# Patient Record
Sex: Male | Born: 1985 | Race: Black or African American | Hispanic: No | Marital: Single | Smoking: Current every day smoker
Health system: Southern US, Community
[De-identification: ages and names within clinical notes are randomized; demographics above are authoritative.]

## PROBLEM LIST (undated history)

## (undated) DIAGNOSIS — Z720 Tobacco use: Secondary | ICD-10-CM

## (undated) DIAGNOSIS — F1910 Other psychoactive substance abuse, uncomplicated: Secondary | ICD-10-CM

## (undated) HISTORY — PX: LIP REPAIR: SHX440

---
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
## 2009-07-23 ENCOUNTER — Ambulatory Visit: Payer: Self-pay | Admitting: Family Medicine

## 2009-07-23 ENCOUNTER — Encounter: Payer: Self-pay | Admitting: Family Medicine

## 2009-07-23 DIAGNOSIS — Z8659 Personal history of other mental and behavioral disorders: Secondary | ICD-10-CM

## 2009-12-15 ENCOUNTER — Emergency Department (HOSPITAL_COMMUNITY): Admission: EM | Admit: 2009-12-15 | Discharge: 2009-12-15 | Payer: Self-pay | Admitting: Emergency Medicine

## 2009-12-23 ENCOUNTER — Emergency Department (HOSPITAL_COMMUNITY): Admission: EM | Admit: 2009-12-23 | Discharge: 2009-12-23 | Payer: Self-pay | Admitting: Emergency Medicine

## 2010-04-03 NOTE — Miscellaneous (Signed)
  Clinical Lists Changes  Medications: Added new medication of METADATE CD 30 MG CR-CAPS (METHYLPHENIDATE HCL) 1 tab twice a day Observations: Added new observation of FAMILY HX: ADD/ADHD (07/23/2009 10:51) Added new observation of SOCIAL HX: Lives with grandmoter Jeremy Hawkins).  Enrolling in school in the fall. 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  Enrolling in school in the fall.

## 2010-04-03 NOTE — Assessment & Plan Note (Signed)
Summary: NP,tcb   Vital Signs:  Patient profile:   25 year old male Height:      63.5 inches Weight:      114 pounds BMI:     19.95 BSA:     1.53 Temp:     98.5 degrees F Pulse rate:   51 / minute BP sitting:   102 / 66  Vitals Entered By: Jone Baseman CMA (Jul 23, 2009 2:49 PM) CC: np Is Patient Diabetic? No Pain Assessment Patient in pain? no        CC:  np.  History of Present Illness: New Patient Visit  Issues 1. ADD:  He has a hx of ADD and was on Adderall.  He hasn't been on it for the past couple of years because he hasn't been able to afford it.  He is now trying to get Medicaid so would like to start taking it again.  He is also thinking about starting school again (wants to go to [employer] for psychology).  His main problem is with concentration and focus on a task.  Health Maintanence: 1. Smokes: not interested in quitting 2. Does not drink or do other drugs 3. Wears his seatbelt 4. Does not exercise or watch his diet  Habits & Providers  Alcohol-Tobacco-Diet     Tobacco Status: current     Tobacco Counseling: to quit use of tobacco products     Cigarette Packs/Day: 0.75  Allergies: No Known Drug Allergies  Past History:  Past Medical History: None  Family History: Reviewed history from 07/23/2009 and no changes required. ADD/ADHD  Social History: Reviewed history from 07/23/2009 and no changes required. Lives with grandmoter Gwendolyn Grant).  Enrolling in school in the fall.Smoking Status:  current Packs/Day:  0.75  Review of Systems  The patient denies fever, weight loss, chest pain, dyspnea on exertion, and headaches.    Physical Exam  General:  Vitals reviewed.  No acute distress. Head:  normocephalic and atraumatic.   Eyes:  vision grossly intact.   Neck:  supple and no masses.   Lungs:  normal respiratory effort, normal breath sounds, no crackles, and no wheezes.   Heart:  normal rate, regular rhythm, and no murmur.     Abdomen:  soft, non-tender, and normal bowel sounds.   Msk:  normal ROM and no joint tenderness.   Extremities:  no lower extremity edema Neurologic:  alert & oriented X3, cranial nerves II-XII intact, strength normal in all extremities, and sensation intact to light touch.   Psych:  poor eye contact.  Normally interactive.  not anxious appearing and not depressed appearing.     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  Patient Instructions: 1)  It was nice to meet you today 2)  We need to do a formal evaluation to make sure that you still have AD/HD and still require treatment 3)  We do that through [facility] and their AD/HD clinic 4)  Their number is [phone] 5)  Please schedule an appointment with them 6)  Schedule an appointment with me after you are seen there

## 2012-09-13 ENCOUNTER — Encounter (HOSPITAL_COMMUNITY): Payer: Self-pay | Admitting: Emergency Medicine

## 2012-09-13 ENCOUNTER — Emergency Department (HOSPITAL_COMMUNITY): Payer: Self-pay

## 2012-09-13 ENCOUNTER — Emergency Department (HOSPITAL_COMMUNITY)
Admission: EM | Admit: 2012-09-13 | Discharge: 2012-09-13 | Disposition: A | Discharge status: Home / Self Care | Payer: Self-pay | Attending: Emergency Medicine | Admitting: Emergency Medicine

## 2012-09-13 DIAGNOSIS — M940 Chondrocostal junction syndrome [Tietze]: Secondary | ICD-10-CM | POA: Insufficient documentation

## 2012-09-13 DIAGNOSIS — F17200 Nicotine dependence, unspecified, uncomplicated: Secondary | ICD-10-CM | POA: Insufficient documentation

## 2012-09-13 LAB — URINALYSIS, ROUTINE W REFLEX MICROSCOPIC
Bilirubin Urine: NEGATIVE
Glucose, UA: NEGATIVE mg/dL
Hgb urine dipstick: NEGATIVE
Ketones, ur: NEGATIVE mg/dL
Leukocytes, UA: NEGATIVE
Nitrite: NEGATIVE
Protein, ur: NEGATIVE mg/dL
Specific Gravity, Urine: 1.025 (ref 1.005–1.030)
Urobilinogen, UA: 2 mg/dL — ABNORMAL HIGH (ref 0.0–1.0)
pH: 6.5 (ref 5.0–8.0)

## 2012-09-13 LAB — CBC WITH DIFFERENTIAL/PLATELET
Basophils Absolute: 0 10*3/uL (ref 0.0–0.1)
Basophils Relative: 1 % (ref 0–1)
Eosinophils Absolute: 0.3 10*3/uL (ref 0.0–0.7)
Eosinophils Relative: 4 % (ref 0–5)
HCT: 39.5 % (ref 39.0–52.0)
Hemoglobin: 13.7 g/dL (ref 13.0–17.0)
Lymphocytes Relative: 40 % (ref 12–46)
Lymphs Abs: 2.6 10*3/uL (ref 0.7–4.0)
MCH: 30.6 pg (ref 26.0–34.0)
MCHC: 34.7 g/dL (ref 30.0–36.0)
MCV: 88.2 fL (ref 78.0–100.0)
Monocytes Absolute: 0.6 10*3/uL (ref 0.1–1.0)
Monocytes Relative: 10 % (ref 3–12)
Neutro Abs: 2.9 10*3/uL (ref 1.7–7.7)
Neutrophils Relative %: 46 % (ref 43–77)
Platelets: 266 10*3/uL (ref 150–400)
RBC: 4.48 MIL/uL (ref 4.22–5.81)
RDW: 13.5 % (ref 11.5–15.5)
WBC: 6.4 10*3/uL (ref 4.0–10.5)

## 2012-09-13 LAB — BASIC METABOLIC PANEL
BUN: 19 mg/dL (ref 6–23)
CO2: 31 mEq/L (ref 19–32)
Calcium: 9.2 mg/dL (ref 8.4–10.5)
Chloride: 97 mEq/L (ref 96–112)
Creatinine, Ser: 1.15 mg/dL (ref 0.50–1.35)
GFR calc Af Amer: >90 mL/min (ref >90)
GFR calc non Af Amer: 85 mL/min — ABNORMAL LOW (ref >90)
Glucose, Bld: 78 mg/dL (ref 70–99)
Potassium: 3.4 mEq/L — ABNORMAL LOW (ref 3.5–5.1)
Sodium: 137 mEq/L (ref 135–145)

## 2012-09-13 MED ORDER — IBUPROFEN 800 MG PO TABS
800.0000 mg | ORAL_TABLET | Freq: Once | ORAL | Status: AC
  Administered 2012-09-13: 800 mg via ORAL
  Filled 2012-09-13: qty 1

## 2012-09-13 MED ORDER — IBUPROFEN 400 MG PO TABS: 400.0000 mg | ORAL_TABLET | Freq: Four times a day (QID) | ORAL | Status: DC | PRN

## 2012-09-13 MED ORDER — METHOCARBAMOL 500 MG PO TABS: 500.0000 mg | ORAL_TABLET | Freq: Two times a day (BID) | ORAL | Status: DC

## 2012-09-13 NOTE — ED Notes (Signed)
Patient states R flank pain and R abdominal pain.  Patient states started x 2 days ago.   Patient denies urinary symptoms at all.   Patient denies nausea/vomiting/diarrhea.   Patient described pain as 8/10 sharp, intermittent pain.

## 2012-09-13 NOTE — ED Provider Notes (Signed)
Medical screening examination/treatment/procedure(s) were performed by non-physician practitioner and as supervising physician I was immediately available for consultation/collaboration.  Dilia Alemany T Amyah Clawson, MD 09/13/12 1020 

## 2012-09-13 NOTE — ED Provider Notes (Signed)
History    CSN: [id number] Arrival date & time 09/13/12  0727  First MD Initiated Contact with Patient 09/13/12 [phone]     Chief Complaint  Patient presents with  . Abdominal Pain   (Consider location/radiation/quality/duration/timing/severity/associated sxs/prior Treatment) HPI  27 year old male with no significant past medical history presents complaining of right flank pain. Patient states he was in the dark when he does a lot of heavy lifting. 2 days ago he woke up noticing pain to his right side of chest and abdomen. Pain is described as a sharp and throbbing sensation worsening with movement. He reports when he was lifting boxes at work yesterday, he felt shortness of breath with increasing pain with lifting. He was recommended by his job to have a medical evaluation before returning to work. Otherwise patient denies fever, chills, back pain, dysuria, hematuria, nausea, vomiting, diarrhea, numbness, weakness, or rash. No specific treatment tried. No prior history of kidney stone. No prior abdominal surgery. No loss of appetite. Patient is a 0.25 pack-a-day smoker and an occasional social drinker.  History reviewed. No pertinent past medical history. Past Surgical History  Procedure Laterality Date  . Lip repair     No family history on file. History  Substance Use Topics  . Smoking status: Current Every Day Smoker -- 0.25 packs/day    Types: Cigarettes  . Smokeless tobacco: Not on file  . Alcohol Use: 3.6 oz/week    6 Cans of beer per week    Review of Systems  Constitutional: Negative for fever.  HENT: Negative for neck pain.   Respiratory: Positive for shortness of breath. Negative for cough, chest tightness and wheezing.   Gastrointestinal: Negative for abdominal pain.  Musculoskeletal: Negative for back pain.  Skin: Negative for rash.  All other systems reviewed and are negative.    Allergies  Review of patient's allergies indicates no known allergies.  Home  Medications  No current outpatient prescriptions on file. BP 121/62  Pulse 56  Temp(Src) 98.1 F (36.7 C) (Oral)  Resp 18  Ht 5\' 3"  (1.6 m)  Wt 120 lb (54.432 kg)  BMI 21.26 kg/m2  SpO2 99% Physical Exam  Nursing note and vitals reviewed. Constitutional: He appears well-developed and well-nourished. No distress.  HENT:  Head: Atraumatic.  Eyes: Conjunctivae are normal.  Neck: Neck supple.  Cardiovascular:  Irregularly irregular rhythm, with no murmurs, rubs or gallops noted  Pulmonary/Chest: Effort normal and breath sounds normal. He has no wheezes. He has no rales. He exhibits tenderness (Tenderness to right anterolateral lower ribs on palpation without crepitus, emphysema, or deformity noted).  Abdominal: There is no tenderness. There is no guarding.  No Murphy sign, no McBurney's point  Genitourinary:  No CVA tenderness  Musculoskeletal: He exhibits no edema.  No significant midline spine tenderness, crepitus, or step off noted  Neurological: He is alert.  Skin: No rash noted.  Psychiatric: He has a normal mood and affect.    ED Course  Procedures (including critical care time)  8:02 AM Patient presents with right lower rib pain, pain is reproducible on exam. Pain is worsening with movement. I suspect this is musculoskeletal in origin. Patient does complain of some shortness of breath and increasing pain with lifting, will obtain a right rib x-ray to rule out occult fracture or any evidence of pneumothorax. Abdominal exam is unremarkable. He has no CVA tenderness. I have low suspicion of kidney stones, appendicitis, or biliary disease.  Patient does have an irregular heart rhythm however  he is not symptomatic.  10:10 AM Work up unremarkable, pt felt better after taking ibuprofen.  Pain likely MSK in nature.  Will treat conservatively.  Pt given strict return precaution.  Pt stable for discharge.   Labs Reviewed  BASIC METABOLIC PANEL - Abnormal; Notable for the following:     Potassium 3.4 (*)    GFR calc non Af Amer 85 (*)    All other components within normal limits  URINALYSIS, ROUTINE W REFLEX MICROSCOPIC - Abnormal; Notable for the following:    Urobilinogen, UA 2.0 (*)    All other components within normal limits  CBC WITH DIFFERENTIAL   Dg Ribs Unilateral W/chest Right  09/13/2012   *RADIOLOGY REPORT*  Clinical Data: Abdominal pain, week pain  RIGHT RIBS AND CHEST - 3+ VIEW  Comparison: None.  Findings: Three views right ribs submitted.  No acute infiltrate or pulmonary edema.  No right rib fracture is identified.  No diagnostic pneumothorax.  IMPRESSION: No right rib fracture is identified.  No diagnostic pneumothorax.   Original Report Authenticated By: Natasha Mead, M.D.   1. Costochondritis, acute     MDM  BP 99/51  Pulse 76  Temp(Src) 98.1 F (36.7 C) (Oral)  Resp 18  Ht 5\' 3"  (1.6 m)  Wt 120 lb (54.432 kg)  BMI 21.26 kg/m2  SpO2 99%   Fayrene Helper, PA-C 09/13/12 1011

## 2012-09-14 ENCOUNTER — Telehealth (HOSPITAL_COMMUNITY): Payer: Self-pay | Admitting: Emergency Medicine

## 2014-01-08 ENCOUNTER — Encounter (HOSPITAL_COMMUNITY): Payer: Self-pay | Admitting: Emergency Medicine

## 2014-01-08 ENCOUNTER — Emergency Department (HOSPITAL_COMMUNITY)
Admission: EM | Admit: 2014-01-08 | Discharge: 2014-01-08 | Disposition: A | Discharge status: Home / Self Care | Payer: Self-pay | Attending: Emergency Medicine | Admitting: Emergency Medicine

## 2014-01-08 DIAGNOSIS — Z79899 Other long term (current) drug therapy: Secondary | ICD-10-CM | POA: Insufficient documentation

## 2014-01-08 DIAGNOSIS — K0889 Other specified disorders of teeth and supporting structures: Secondary | ICD-10-CM

## 2014-01-08 DIAGNOSIS — K088 Other specified disorders of teeth and supporting structures: Secondary | ICD-10-CM | POA: Insufficient documentation

## 2014-01-08 DIAGNOSIS — K0381 Cracked tooth: Secondary | ICD-10-CM | POA: Insufficient documentation

## 2014-01-08 DIAGNOSIS — Z720 Tobacco use: Secondary | ICD-10-CM | POA: Insufficient documentation

## 2014-01-08 MED ORDER — PENICILLIN V POTASSIUM 500 MG PO TABS: 500.0000 mg | ORAL_TABLET | Freq: Four times a day (QID) | ORAL | Status: AC

## 2014-01-08 MED ORDER — PENICILLIN V POTASSIUM 250 MG PO TABS
500.0000 mg | ORAL_TABLET | Freq: Once | ORAL | Status: AC
  Administered 2014-01-08: 500 mg via ORAL
  Filled 2014-01-08: qty 2

## 2014-01-08 MED ORDER — HYDROCODONE-ACETAMINOPHEN 5-325 MG PO TABS
2.0000 | ORAL_TABLET | Freq: Once | ORAL | Status: AC
  Administered 2014-01-08: 2 via ORAL
  Filled 2014-01-08: qty 2

## 2014-01-08 MED ORDER — HYDROCODONE-ACETAMINOPHEN 5-325 MG PO TABS: 1.0000 | ORAL_TABLET | ORAL | Status: DC | PRN

## 2014-01-08 NOTE — ED Provider Notes (Signed)
CSN: [id number]     Arrival date & time 01/08/14  1820 History  This chart was scribed for non-physician practitioner, Harle BattiestElizabeth Uliana Brinker, NP working with Gwyneth SproutWhitney Plunkett, MD by Gwenyth Oberatherine Macek, ED scribe. This patient was seen in room TR05C/TR05C and the patient's care was started at 6:34 PM.   Chief Complaint  Patient presents with  . Dental Pain   The history is provided by the patient. No language interpreter was used.    HPI Comments: Jeremy Hawkins is a 28 y.o. male who presents to the Emergency Department complaining of gradually worsening, constant dental pain in 3rd left molar that started several months ago when he felt his tooth break. Pt states that his tooth is cracked and that a nerve is exposed. He has not tried any treatments for his symptoms. Pt denies any other tooth pain.  History reviewed. No pertinent past medical history. Past Surgical History  Procedure Laterality Date  . Lip repair     No family history on file. History  Substance Use Topics  . Smoking status: Current Every Day Smoker -- 0.25 packs/day    Types: Cigarettes  . Smokeless tobacco: Not on file  . Alcohol Use: 3.6 oz/week    6 Cans of beer per week    Review of Systems  Constitutional: Negative for fever.  HENT: Positive for dental problem. Negative for facial swelling.     Allergies  Review of patient's allergies indicates no known allergies.  Home Medications   Prior to Admission medications   Medication Sig Start Date End Date Taking? Authorizing Provider  ibuprofen (ADVIL,MOTRIN) 400 MG tablet Take 1 tablet (400 mg total) by mouth every 6 (six) hours as needed for pain. 09/13/12   Fayrene HelperBowie Tran, PA-C  methocarbamol (ROBAXIN) 500 MG tablet Take 1 tablet (500 mg total) by mouth 2 (two) times daily. 09/13/12   Fayrene HelperBowie Tran, PA-C   BP 116/75 mmHg  Pulse 74  Temp(Src) 98.6 F (37 C)  Resp 16  Wt 122 lb 3 oz (55.424 kg)  SpO2 100% Physical Exam  Constitutional: He is oriented to  person, place, and time. He appears well-developed and well-nourished. No distress.  HENT:  Head: Normocephalic and atraumatic.  Mouth/Throat: Oropharynx is clear and moist. No oropharyngeal exudate.  Top left rear molar cracked and broken No cervical lymphadenopathy No facial swelling  Eyes: Pupils are equal, round, and reactive to light.  Neck: Neck supple.  Cardiovascular: Normal rate.   Pulmonary/Chest: Effort normal.  Musculoskeletal: He exhibits no edema.  Lymphadenopathy:    He has no cervical adenopathy.  Neurological: He is alert and oriented to person, place, and time. No cranial nerve deficit.  Skin: Skin is warm and dry. No rash noted.  Psychiatric: He has a normal mood and affect. His behavior is normal.  Nursing note and vitals reviewed.   ED Course  Procedures (including critical care time) DIAGNOSTIC STUDIES: Oxygen Saturation is 100% on RA, normal by my interpretation.    COORDINATION OF CARE: 6:39 PM Discussed treatment plan with pt at bedside and pt agreed to plan.  Labs Review Labs Reviewed - No data to display  Imaging Review No results found.   EKG Interpretation None      MDM   Final diagnoses:  Pain, dental   28 yo male with toothache from broken tooth x several months.  There is no gross abscess or soft tissue swelling noted.  Exam unconcerning for Ludwig's angina or spread of infection.  Will treat  with penicillin and pain medicine.  Discharge instructions include referral to  follow-up with dentist.  Pt aware of plan and in agreement.  Return precautions provided.  I personally performed the services described in this documentation, which was scribed in my presence. The recorded information has been reviewed and is accurate.  Filed Vitals:   01/08/14 1827  BP: 116/75  Pulse: 74  Temp: 98.6 F (37 C)  Resp: 16  Weight: 122 lb 3 oz (55.424 kg)  SpO2: 100%   Meds given in ED:  Medications  penicillin v potassium (VEETID) tablet 500  mg (not administered)  HYDROcodone-acetaminophen (NORCO/VICODIN) 5-325 MG per tablet 2 tablet (not administered)    New Prescriptions   HYDROCODONE-ACETAMINOPHEN (NORCO/VICODIN) 5-325 MG PER TABLET    Take 1-2 tablets by mouth every 4 (four) hours as needed for moderate pain or severe pain.   PENICILLIN V POTASSIUM (VEETID) 500 MG TABLET    Take 1 tablet (500 mg total) by mouth 4 (four) times daily.      Harle BattiestElizabeth Jonnathan Birman, NP 01/08/14 1851  Gwyneth SproutWhitney Plunkett, MD 01/09/14 [phone]

## 2014-01-08 NOTE — Discharge Instructions (Signed)
Please follow the directions provided. It is very important that you follow-up with the dental referral given to treat this broken tooth. The medicines given today will help with pain and possible infection, but it will not fix the broken tooth. Take your pain medicine and antibiotic as directed. Don't hesitate to return for any new, worsening, or concerning symptoms.  SEEK IMMEDIATE MEDICAL CARE IF:  You have a fever.  You develop redness and swelling of your face, jaw, or neck.  You are unable to open your mouth.  You have severe pain uncontrolled by pain medicine.    Emergency Department Resource Guide 1) Find a Doctor and Pay Out of Pocket Although you won't have to find out who is covered by your insurance plan, it is a good idea to ask around and get recommendations. You will then need to call the office and see if the doctor you have chosen will accept you as a new patient and what types of options they offer for patients who are self-pay. Some doctors offer discounts or will set up payment plans for their patients who do not have insurance, but you will need to ask so you aren't surprised when you get to your appointment.  2) Contact Your Local Health Department Not all health departments have doctors that can see patients for sick visits, but many do, so it is worth a call to see if yours does. If you don't know where your local health department is, you can check in your phone book. The CDC also has a tool to help you locate your state's health department, and many state websites also have listings of all of their local health departments.  3) Find a Walk-in Clinic If your illness is not likely to be very severe or complicated, you may want to try a walk in clinic. These are popping up all over the country in pharmacies, drugstores, and shopping centers. They're usually staffed by nurse practitioners or physician assistants that have been trained to treat common illnesses and complaints.  They're usually fairly quick and inexpensive. However, if you have serious medical issues or chronic medical problems, these are probably not your best option.  No Primary Care Doctor: - Call Health Connect at  [phone] - they can help you locate a primary care doctor that  accepts your insurance, provides certain services, etc. - Physician Referral Service- [phone]  Chronic Pain Problems: Organization         Address  Phone   Notes  [facility]  [phone] Patients need to be referred by their primary care doctor.   Medication Assistance: Organization         Address  Phone   Notes  [facility] [address] [place] [place], [place] [address] [phone] --Must be a resident of [facility] -- Must have NO insurance coverage whatsoever (no Medicaid/ Medicare, etc.) -- The pt. MUST have a primary care doctor that directs their care regularly and follows them in the community   Med[facility]ist  [phone]   [employer]  [phone]    Agencies that provide inexpensive medical care: Organization         Address  Phone   Notes  [facility]  [phone]   [facility]    [phone]   [facility] [address] [place], [place] [address] [phone]   [facility]  [address], [place] [phone]) [phone]   Planned Parenthood    [phone]   [facility]    [phone]   Community Health and [facility]  [address], [place] Phone:  [phone]) [phone], Fax:  [phone] Hours of Operation:  9 am - 6 pm, M-F.  Also accepts Medicaid/Medicare and self-pay.  [facility]  [address], [place] Phone: [phone], Fax: [phone]. Hours of Operation:  8:30 am - 5:30 pm, M-F.  Also accepts Medicaid and self-pay.  [facility] [place] [address], [place]  Phone: [phone]   [facility] [address], [place], [place] [phone], Ext. [phone] Mondays & Thursdays: 7-9 AM.  First 15 patients are seen on a first come, first serve basis.    Medicaid-accepting [facility] Providers:  Organization         Address  Phone   Notes  [facility] [address], [place] [phone] Also accepts self-pay patients.  [facility] [address] [place], [place]  [phone]   [facility] [address], [place] [phone]) [phone]   [facility] [address] [place] [address], [place] [phone]) [phone]   Renaye RakersVeita Bland [address], [place]   [phone] Only accepts [place] Access [place]icaid patients after they have their name applied to their card.   Self-Pay (no insurance) in [facility]:  Organization         Address  Phone   Notes  Sickle Cell Patients, [facility] [address], [place] [phone]   [facility] [address], [place] [phone]) [phone]   [facility]  [address], [place] [phone]   [facility]/Dr. Osei-Bonsu  [address] [place] [address], [place] or [phone], [place] [phone] Phone number for both [place] and [place] locations is the same.  [facility]gent [facility] and [facility] [address], [place] [phone]) [phone]   [facility] [place] [address] [place] [address], [place] or [address] [phone]) [phone]   [facility] [address], [place] [phone]) [phone], phone; [phone], fax Sees patients 1st and 3rd Saturday of every month.  Must not qualify for public or private insurance (i.e. Medicaid, Medicare, [place] Health Choice, Veterans' Benefits)  Household income should be no more than 200% of the poverty level The clinic cannot  treat you if you are pregnant or think you are pregnant  Sexually transmitted diseases are not treated at the clinic.    Dental Care: Organization         Address  Phone  Notes  [facility] Department of [facility] [address], [place] [phone] Accepts children up to age 921 who are enrolled in [place]icaid or [place] Health Choice; pregnant women with a Medicaid card; and children who have applied for Medicaid or [place] Health Choice, but were declined, whose parents can pay a reduced fee at time of service.  [facility] of [facility] [place]  [address], [place] [phone] Accepts children up to age 30 who are enrolled in [place]icaid or [place] Health Choice; pregnant women with a Medicaid card; and children who have applied for  Medicaid or [place] Health Choice, but were declined, whose parents can pay a reduced fee at time of service.  [facility]  [address], [place] [phone] Patients are seen by appointment only. Walk-ins are not accepted. [facility] will see patients 30 years of age and older. Monday - Tuesday (8am-5pm) Most Wednesdays (8:30-5pm) $30 per visit, cash only  [facility]  [address], [facility] [phone] Patients are seen by appointment only. Walk-ins are not accepted. [facility] will see patients 30 years of age and older. One Wednesday Evening (Monthly: Volunteer Based).  $30 per visit, cash only  [facility]  [phone] for adults; Children under age 604, call Graduate Pediatric Dentistry at [phone]. Children aged 204-14, please call [phone] to request a pediatric application.  Dental services are provided in all areas of dental care including fillings, crowns and bridges, complete and partial dentures, implants, gum treatment, root canals, and extractions. Preventive care is also provided.  Treatment is provided to both adults and children. Patients are selected via a lottery and there is often a waiting list.   [facility] [address], [place]  [phone] [email or web address]   [facility] [address], [place], [place] [phone], Ext. [phone] Second and Fourth Thursday of each month, opens at 6:30 AM; Clinic ends at 9 AM.  Patients are seen on a first-come first-served basis, and a limited number are seen during each clinic.   [facility]  [address], [place], [place] [phone]   Eligibility Requirements You must have lived in [place], [place], or [place] counties for at least the last three months.   You cannot be eligible for state or federal sponsored National Cityhealthcare insurance, including CIGNAVeterans Administration, [place]icaid, or [employer]Medicare.   You generally cannot be eligible for healthcare insurance through your employer.    How to apply: Eligibility screenings are held every Tuesday and Wednesday afternoon from 1:00 pm until 4:00 pm. You do not need an appointment for the interview!  [facility] [address], [place], [place] [phone]   [facility]  [phone]   [facility]  [phone]   [facility]  [phone]    Behavioral Health Resources in the Community: Intensive Outpatient Programs Organization         Address  Phone  Notes  [facility] [address], [place], [place] [phone]   [facility] [address], [place], [place] [phone]   [facility] [address], [place], [place]  [phone]   [facility] [address],  [place], [place] [phone] or [phone]   Substance Abuse Resources Organization         Address  Phone  Notes  [facility]  [phone]   [facility]   [phone]   [facility]  [phone]   [facility]  [phone]   Residential & Outpatient Substance Abuse Program  [phone]   Psychological Services Organization         Address  Phone  Notes  [facility]  [phone]- [phone]   [facility]  [phone]- [phone]   [facility] [address], [place] [phone] or [phone]    Mobile Crisis Teams Organization         Address  Phone  Notes  [facility], Mobile Crisis Care Unit  [phone]   [facility]  [address] [place], [place] [phone]   [facility] [address] [place] [phone]    Self-Help/Support Groups Organization         Address  Phone             Notes  [facility] [place] - variety of support groups  [phone] Call for more information  Narcotics Anonymous (NA), Caring [facility] [address], [employer] [place]  2 meetings at this location   Statistician         Address  Phone  Notes  [facility] [address],    [place]  [phone]   [facility]  [address], [place] [address], [place], [place] [phone]   [facility] [address] [place], [place] [phone] Admissions: 8am-3pm M-F  [facility] [address],    [place], [place] [phone]   [facility] [address] [place], [place], [place] [phone]   [facility] [address]  [place], [place] [phone]   [facility] - Intensive Outpatient [address], [place], [place] [phone]   [facility] ([facility]iction [facility] [address]  [place], [place] [phone] or [phone]   [facility] (RTS) [address] [place], [place] [phone] Accepts Medicaid  [facility] [place] [address]  [place] [phone] Substance  Abuse/Addiction Treatment   [facility] Organization         Address  Phone  Notes  [facility]  [phone]   Angie Fava, PhD [address] [place], [place]   [phone] or [phone]   [facility]   [address] [place], [place] [phone]   [facility] [address], [place], [place] [phone] Insurance/Medicaid/sponsorship through [employer] and Families [address]                                    [place], [place] [phone] Therapy/tele-psych/case  [facility] [address], [place] [phone]    Dr. Lolly Mustache  [phone]   [facility]  [employer] [facility] Dept. 1) [address] [place] 2) [address] [place] 3)  [address], [place] [phone]  [phone]   [facility] Child Abuse Hotline [phone] or [phone] (After Hours)

## 2014-01-08 NOTE — ED Notes (Signed)
Refused wheelchair 

## 2014-01-08 NOTE — ED Notes (Signed)
Pt reports top L wisdom tooth pain from it being broken months ago. sts nerve is exposed.

## 2014-01-10 ENCOUNTER — Telehealth (HOSPITAL_BASED_OUTPATIENT_CLINIC_OR_DEPARTMENT_OTHER): Payer: Self-pay | Admitting: Emergency Medicine

## 2014-12-11 ENCOUNTER — Emergency Department (HOSPITAL_COMMUNITY): Payer: Self-pay

## 2014-12-11 ENCOUNTER — Encounter (HOSPITAL_COMMUNITY): Payer: Self-pay

## 2014-12-11 ENCOUNTER — Observation Stay (HOSPITAL_COMMUNITY)
Admission: EM | Admit: 2014-12-11 | Discharge: 2014-12-12 | Disposition: A | Discharge status: Home / Self Care | Payer: Self-pay | Attending: Cardiology | Admitting: Cardiology

## 2014-12-11 DIAGNOSIS — R072 Precordial pain: Secondary | ICD-10-CM

## 2014-12-11 DIAGNOSIS — F1910 Other psychoactive substance abuse, uncomplicated: Secondary | ICD-10-CM | POA: Insufficient documentation

## 2014-12-11 DIAGNOSIS — Z720 Tobacco use: Secondary | ICD-10-CM

## 2014-12-11 DIAGNOSIS — R079 Chest pain, unspecified: Secondary | ICD-10-CM

## 2014-12-11 DIAGNOSIS — R7989 Other specified abnormal findings of blood chemistry: Secondary | ICD-10-CM | POA: Insufficient documentation

## 2014-12-11 DIAGNOSIS — R9431 Abnormal electrocardiogram [ECG] [EKG]: Secondary | ICD-10-CM

## 2014-12-11 DIAGNOSIS — R55 Syncope and collapse: Principal | ICD-10-CM

## 2014-12-11 HISTORY — DX: Tobacco use: Z72.0

## 2014-12-11 HISTORY — DX: Other psychoactive substance abuse, uncomplicated: F19.10

## 2014-12-11 LAB — CBC
HEMATOCRIT: 41.5 % (ref 39.0–52.0)
HEMOGLOBIN: 14.2 g/dL (ref 13.0–17.0)
MCH: 30.2 pg (ref 26.0–34.0)
MCHC: 34.2 g/dL (ref 30.0–36.0)
MCV: 88.3 fL (ref 78.0–100.0)
Platelets: 297 10*3/uL (ref 150–400)
RBC: 4.7 MIL/uL (ref 4.22–5.81)
RDW: 13.9 % (ref 11.5–15.5)
WBC: 6.5 10*3/uL (ref 4.0–10.5)

## 2014-12-11 LAB — CBC WITH DIFFERENTIAL/PLATELET
BASOS ABS: 0.1 10*3/uL (ref 0.0–0.1)
Basophils Relative: 1 %
EOS ABS: 1 10*3/uL — AB (ref 0.0–0.7)
EOS PCT: 16 %
HCT: 40.5 % (ref 39.0–52.0)
Hemoglobin: 13.8 g/dL (ref 13.0–17.0)
Lymphocytes Relative: 49 %
Lymphs Abs: 2.8 10*3/uL (ref 0.7–4.0)
MCH: 30 pg (ref 26.0–34.0)
MCHC: 34.1 g/dL (ref 30.0–36.0)
MCV: 88 fL (ref 78.0–100.0)
Monocytes Absolute: 0.5 10*3/uL (ref 0.1–1.0)
Monocytes Relative: 8 %
Neutro Abs: 1.6 10*3/uL — ABNORMAL LOW (ref 1.7–7.7)
Neutrophils Relative %: 26 %
PLATELETS: 298 10*3/uL (ref 150–400)
RBC: 4.6 MIL/uL (ref 4.22–5.81)
RDW: 14 % (ref 11.5–15.5)
WBC: 5.9 10*3/uL (ref 4.0–10.5)

## 2014-12-11 LAB — BASIC METABOLIC PANEL
ANION GAP: 9 (ref 5–15)
CALCIUM: 8.3 mg/dL — AB (ref 8.9–10.3)
CO2: 28 mmol/L (ref 22–32)
Chloride: 103 mmol/L (ref 101–111)
Creatinine, Ser: 0.84 mg/dL (ref 0.61–1.24)
GFR calc Af Amer: >60 mL/min (ref >60)
Glucose, Bld: 96 mg/dL (ref 65–99)
POTASSIUM: 3.6 mmol/L (ref 3.5–5.1)
SODIUM: 140 mmol/L (ref 135–145)

## 2014-12-11 LAB — CREATININE, SERUM: Creatinine, Ser: 0.88 mg/dL (ref 0.61–1.24)

## 2014-12-11 LAB — TSH: TSH: 3.696 u[IU]/mL (ref 0.350–4.500)

## 2014-12-11 LAB — TROPONIN I: Troponin I: <0.03 ng/mL (ref <0.031)

## 2014-12-11 LAB — I-STAT TROPONIN, ED: TROPONIN I, POC: 0 ng/mL (ref 0.00–0.08)

## 2014-12-11 LAB — MAGNESIUM: Magnesium: 1.9 mg/dL (ref 1.7–2.4)

## 2014-12-11 LAB — CBG MONITORING, ED: GLUCOSE-CAPILLARY: 76 mg/dL (ref 65–99)

## 2014-12-11 LAB — PROTIME-INR
INR: 1.02 (ref 0.00–1.49)
Prothrombin Time: 13.6 seconds (ref 11.6–15.2)

## 2014-12-11 MED ORDER — ENOXAPARIN SODIUM 40 MG/0.4ML ~~LOC~~ SOLN
40.0000 mg | SUBCUTANEOUS | Status: DC
  Administered 2014-12-11: 40 mg via SUBCUTANEOUS
  Filled 2014-12-11: qty 0.4

## 2014-12-11 MED ORDER — ASPIRIN 300 MG RE SUPP: 300.0000 mg | RECTAL | Status: AC

## 2014-12-11 MED ORDER — NITROGLYCERIN 0.4 MG SL SUBL
0.4000 mg | SUBLINGUAL_TABLET | SUBLINGUAL | Status: DC | PRN
  Administered 2014-12-11: 0.4 mg via SUBLINGUAL
  Filled 2014-12-11: qty 1

## 2014-12-11 MED ORDER — SODIUM CHLORIDE 0.9 % IV SOLN: 250.0000 mL | INTRAVENOUS | Status: DC | PRN

## 2014-12-11 MED ORDER — ONDANSETRON HCL 4 MG/2ML IJ SOLN
4.0000 mg | Freq: Four times a day (QID) | INTRAMUSCULAR | Status: DC | PRN
  Administered 2014-12-11: 4 mg via INTRAVENOUS
  Filled 2014-12-11 (×2): qty 2

## 2014-12-11 MED ORDER — SODIUM CHLORIDE 0.9 % IJ SOLN
3.0000 mL | Freq: Two times a day (BID) | INTRAMUSCULAR | Status: DC
  Administered 2014-12-11: 3 mL via INTRAVENOUS

## 2014-12-11 MED ORDER — ONDANSETRON HCL 4 MG/2ML IJ SOLN
4.0000 mg | Freq: Once | INTRAMUSCULAR | Status: AC
  Administered 2014-12-11: 4 mg via INTRAVENOUS
  Filled 2014-12-11: qty 2

## 2014-12-11 MED ORDER — ASPIRIN 81 MG PO CHEW: 324.0000 mg | CHEWABLE_TABLET | ORAL | Status: AC

## 2014-12-11 MED ORDER — SODIUM CHLORIDE 0.9 % IJ SOLN: 3.0000 mL | INTRAMUSCULAR | Status: DC | PRN

## 2014-12-11 MED ORDER — MORPHINE SULFATE (PF) 4 MG/ML IV SOLN
4.0000 mg | Freq: Once | INTRAVENOUS | Status: AC
  Administered 2014-12-11: 4 mg via INTRAVENOUS
  Filled 2014-12-11: qty 1

## 2014-12-11 MED ORDER — ACETAMINOPHEN 325 MG PO TABS: 650.0000 mg | ORAL_TABLET | ORAL | Status: DC | PRN

## 2014-12-11 MED ORDER — ASPIRIN 81 MG PO CHEW
324.0000 mg | CHEWABLE_TABLET | Freq: Once | ORAL | Status: AC
  Administered 2014-12-11: 324 mg via ORAL
  Filled 2014-12-11: qty 4

## 2014-12-11 NOTE — ED Notes (Signed)
Pt. Was in a meeting yesterday, and had a syncopal episode.  Pt. Reports that he is having a tough time and has had poor food intake due to financial .  Prior to the syncopal episode.  Pt. Had chest pain.  Also hit his head.  Pt. 's witnesses at work reports that pt. Was having seizure like activity.  Pt. Has no hx of seizure .  His work notified him this am that he needs a work excuse.  Pt. Continues to feel weak and is having many stressors. Pt. Is alert and oriented X4.  Pt. Reports having chest pain lt. Side, non-radiating , sharp,  Pt. Denies any factors that relieve or worsen the pain.

## 2014-12-11 NOTE — H&P (Signed)
Patient ID: RONDALE Hawkins MRN: [id number], DOB/AGE: 1985/09/17   Admit date: 12/11/2014   Primary Physician: No primary care provider on file. Primary Cardiologist: New CC: CP and syncope  Pt. Profile:  Jeremy Hawkins is a 29 y.o. male with a history of tobacco abuse and occasional cocaine use who presented to [facility] with chest pain and syncopal episode yesterday.   He is a thin AA male with no past medical hx of DM, HTN, HLD, CVA.   Patient operates heavy machinery for work. He was in a work meeting yesterday ~11:30 am he developed left sternal border chest pain that was severe 10/10, he clutched his chest and became very lightheaded then had a syncopal episode, he described the pain as overwhelming and stabbing. No post ictal sx or loss of urine or stool. Co-workers report he did hit his head from a sitting position. Since then he has been having ongoing, intermittent CP that lasts a couple of minutes and then resolves, currently he is having CP that is 7/10 and a squeezing pressure that is assocaited with mild nausea. It is not worse supine and better leaning forward. Not worse with inspiration. He denies hx of chest pains. He has not had any associated diaphoresis or shortness of breath. He denies exertional sx. No palpitations, weakness, confusion, lower extremity swelling, orthopnea, or PND.   He admits to occasional cocaine use (~ 1x/mo) and the last time he used was 3 days ago but not yesterday. He also reports being very stressed lately. He has been out of work, got out of a long relationship with his girlfriend and moved in with his grandmother. He has had poor PO intake due to financial issues. Has been feeling weak. He presented to the hospital today due to weakness and ongoing chest pain. He said he just couldn't go to work as he was too weak. No family hx of CAD but his mother is deceased and he does not have a relationship with his father. His grandmother has  HTN.    Problem List  History reviewed. No pertinent past medical history.  Past Surgical History  Procedure Laterality Date  . Lip repair       Allergies  No Known Allergies   Home Medications  Prior to Admission medications   Medication Sig Start Date End Date Taking? Authorizing Provider  HYDROcodone-acetaminophen (NORCO/VICODIN) 5-325 MG per tablet Take 1-2 tablets by mouth every 4 (four) hours as needed for moderate pain or severe pain. 01/08/14   Harle Battiest, NP  ibuprofen (ADVIL,MOTRIN) 400 MG tablet Take 1 tablet (400 mg total) by mouth every 6 (six) hours as needed for pain. 09/13/12   Fayrene Helper, PA-C  methocarbamol (ROBAXIN) 500 MG tablet Take 1 tablet (500 mg total) by mouth 2 (two) times daily. 09/13/12   Fayrene Helper, PA-C    Family History  No family history on file. No family status information on file.     Social History  Social History   Social History  . Marital Status: Single    Spouse Name: N/A  . Number of Children: N/A  . Years of Education: N/A   Occupational History  . Not on file.   Social History Main Topics  . Smoking status: Current Every Day Smoker -- 0.25 packs/day    Types: Cigarettes  . Smokeless tobacco: Not on file  . Alcohol Use: 3.6 oz/week    6 Cans of beer per week  . Drug Use:  No  . Sexual Activity: Not on file   Other Topics Concern  . Not on file   Social History Narrative     All other systems reviewed and are otherwise negative except as noted above.  Physical Exam  Blood pressure 116/69, pulse 79, temperature 97.9 F (36.6 C), temperature source Oral, resp. rate 14, height 5\' 3"  (1.6 m), weight 120 lb (54.432 kg), SpO2 99 %.  General: Pleasant, NAD Psych: Normal affect. Neuro: Alert and oriented X 3. Moves all extremities spontaneously. HEENT: Normal  Neck: Supple without bruits or JVD. Lungs:  Resp regular and unlabored, CTA. Heart: RRR no s3, s4, or murmurs. Abdomen: Soft, non-tender,  non-distended, BS + x 4.  Extremities: No clubbing, cyanosis or edema. DP/PT/Radials 2+ and equal bilaterally.  Labs   Recent Labs  12/11/14 0947  TROPONINI <0.03   Lab Results  Component Value Date   WBC 5.9 12/11/2014   HGB 13.8 12/11/2014   HCT 40.5 12/11/2014   MCV 88.0 12/11/2014   PLT 298 12/11/2014     Recent Labs Lab 12/11/14 0947  NA 140  K 3.6  CL 103  CO2 28  BUN <5*  CREATININE 0.84  CALCIUM 8.3*  GLUCOSE 96     Radiology/Studies  Dg Chest Port 1 View  12/11/2014   CLINICAL DATA:  Chest pain, syncope  EXAM: PORTABLE CHEST 1 VIEW  COMPARISON:  09/13/2012  FINDINGS: The heart size and mediastinal contours are within normal limits. Both lungs are clear. The visualized skeletal structures are unremarkable.  IMPRESSION: No active disease.   Electronically Signed   By: Jeremy Hawkins M.D.   On: 12/11/2014 10:39    ECG HR 68 Sinus arrhythmia Biatrial enlargement  STE in II, III, AVF and V3-V6 c/w early repol  ASSESSMENT AND PLAN  Jeremy Hawkins is a 29 y.o. male with a history of tobacco abuse and occasional cocaine use who presented to [facility] today with chest pain and syncopal episode yesterday.   Atypical chest pain- patient is young with no RFs aside from tobacco abuse and occasional cocaine abuse -- ECG with STE in II, III, AVF and V3-V6 c/w early repol. -- POC trop neg. Troponin I neg x1. He has ongoing chest pain. Would admit for serial enzymes and ECG although doubt ischemia.   Syncope- no further syncopal episodes. Tele unremarkable. Would continue to monitor on tele. Likely 2/2 to poor PO intake recently.  -- ECG w/ possible epsilon wave at end of QRS complex. 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 2D ECHO as above. May want EP to weigh in.    Dr. Antoine Poche for final disposition.    Jeremy Fischer, PA-C 12/11/2014, 11:19 AM  Pager [phone]  History  and all data above reviewed.  Patient examined.  I agree with the findings as above.  Vague chest pain.  Syncope while standing at work.  No objective evidence of ischemia.   The patient exam reveals COR:RRR  ,  Lungs: Clear  ,  Abd: Positive bowel sounds, no rebound no guarding, Ext No edema .  All available labs, radiology testing, previous records reviewed. Agree with documented assessment and plan. Chest pain is atypical.  He does have bradycardia and had syncope.  I suspect vagal mediated syncope.  Plan to observe overnight and if no evidence of arhythmia, EKG changes or enzyme elevation POET (Plain Old Exercise Treadmill) in the AM to exclude exercise  induced arrhythmia, evaluate BP and HR response.    [name] Keahi Mccarney  1:42 PM  12/11/2014

## 2014-12-11 NOTE — Progress Notes (Signed)
Spoke to patient regarding primary care resources and the [facility] orange card. Orange card application provided and explained, pt instructed to contact me once discharged for an enrollment appointment and to be set up with a pcp. Patient will be linked with a P4CC case manager after enrollment for community resources that patient expressed concerns with. My contact information provided for any future questions or concerns. No other Community Health & Eligibility Specialist needs identified at this time.  Buddy Duty [facility] & Eligibility Specialist Partnership for [facility] [phone]

## 2014-12-11 NOTE — ED Notes (Signed)
Pt. Reports that his nausea is coming back , Reported to Marlon Pel, PA

## 2014-12-11 NOTE — ED Provider Notes (Signed)
CSN: [id number]     Arrival date & time 12/11/14  0919 History   First MD Initiated Contact with Patient 12/11/14 [phone]     Chief Complaint  Patient presents with  . Loss of Consciousness     (Consider location/radiation/quality/duration/timing/severity/associated sxs/prior Treatment) HPI    Jeremy Hawkins is a 29 y.o.  male  Patient has a PMH of cocaine use (last used 3-4  Days ago on Saturday) but reports otherwise healthy presents for evaluation of chest pain and syncopal episode.  Patient was in a meeting yesterday while at work around 11:30 am he developed left sternal border chest pain that was severe 10/10, he clutched his chest and became very lightheaded then had a syncopal episode, he described the pain as overwhelming and stabbing. Co-workers report he did hit his head from a sitting position. Onto the desk or floor is unknown. Since then he has been having CP that lasts a couple of minutes and then resolves, currently he is having CP that is 6/10 and a squeezing pressure. He denies hx of chest pains. He has not had any diaphoresis, nausea, vomiting, diarrhea, weakness, confusion, lower extremity swelling.  Past Medical History  Diagnosis Date  . Tobacco abuse   . Polysubstance abuse     a. occasionally uses cocaine.   Past Surgical History  Procedure Laterality Date  . Lip repair     No family history on file. Social History  Substance Use Topics  . Smoking status: Current Every Day Smoker -- 0.25 packs/day    Types: Cigarettes  . Smokeless tobacco: None  . Alcohol Use: 3.6 oz/week    6 Cans of beer per week    Review of Systems  10 Systems reviewed and are negative for acute change except as noted in the HPI.     Allergies  Review of patient's allergies indicates no known allergies.  Home Medications   Prior to Admission medications   Medication Sig Start Date End Date Taking? Authorizing Provider  HYDROcodone-acetaminophen (NORCO/VICODIN) 5-325  MG per tablet Take 1-2 tablets by mouth every 4 (four) hours as needed for moderate pain or severe pain. Patient not taking: Reported on 12/11/2014 01/08/14   Harle Battiest, NP  ibuprofen (ADVIL,MOTRIN) 400 MG tablet Take 1 tablet (400 mg total) by mouth every 6 (six) hours as needed for pain. Patient not taking: Reported on 12/11/2014 09/13/12   Fayrene Helper, PA-C  methocarbamol (ROBAXIN) 500 MG tablet Take 1 tablet (500 mg total) by mouth 2 (two) times daily. Patient not taking: Reported on 12/11/2014 09/13/12   Fayrene Helper, PA-C   BP 112/78 mmHg  Pulse 42  Temp(Src) 97.9 F (36.6 C) (Oral)  Resp 17  Ht  (1.6 m)  Wt 120 lb (54.432 kg)  BMI 21.26 kg/m2  SpO2 100% Physical Exam  Constitutional: He appears well-developed and well-nourished. No distress.  HENT:  Head: Normocephalic and atraumatic.  Eyes: Pupils are equal, round, and reactive to light.  Neck: Normal range of motion. Neck supple.  Cardiovascular: Normal rate and regular rhythm.   Pulmonary/Chest: Effort normal and breath sounds normal.  Pain is not reproducible to palpation of chest wall.  Abdominal: Soft.  Musculoskeletal:  No LE swelling  Neurological: He is alert.  Skin: Skin is warm and dry.  Nursing note and vitals reviewed.   ED Course  Procedures (including critical care time) Labs Review Labs Reviewed  CBC WITH DIFFERENTIAL/PLATELET - Abnormal; Notable for the following:    Neutro Abs  1.6 (*)    Eosinophils Absolute 1.0 (*)    All other components within normal limits  BASIC METABOLIC PANEL - Abnormal; Notable for the following:    BUN <5 (*)    Calcium 8.3 (*)    All other components within normal limits  TROPONIN I  CBG MONITORING, ED  Rosezena Sensor, ED    Imaging Review Dg Chest Port 1 View  12/11/2014   CLINICAL DATA:  Chest pain, syncope  EXAM: PORTABLE CHEST 1 VIEW  COMPARISON:  09/13/2012  FINDINGS: The heart size and mediastinal contours are within normal limits. Both lungs are  clear. The visualized skeletal structures are unremarkable.  IMPRESSION: No active disease.   Electronically Signed   By: Marlan Palau M.D.   On: 12/11/2014 10:39   I have personally reviewed and evaluated these images and lab results as part of my medical decision-making.   EKG Interpretation   Date/Time:  Tuesday December 11 2014 09:39:16 EDT Ventricular Rate:  68 PR Interval:  146 QRS Duration: 67 QT Interval:  403 QTC Calculation: 429 R Axis:   82 Text Interpretation:  Sinus arrhythmia Biatrial enlargement machine read  as STEMI inferiorly Possible st elevations laterally no concordant changes  Confirmed by FLOYD MD, Reuel Boom ([id number]) on 12/11/2014 9:47:16 AM      MDM   Final diagnoses:  Syncope and collapse  Chest pain, unspecified chest pain type  Abnormal EKG    Dr. Adela Lank spoke with cardiology who reviewed xray and has determine that this patient is NOT a Code STEMI. His first troponin has come back negative. Patient initial work-up with unremarkable/ I spoke with Trish at 1  10: 51 am who has agreed to send a cardiologist for consultation: Concern for pericarditis vs arrhythmic right ventricle   @ 1: 37 pm - cardiology will admit.  Filed Vitals:   12/11/14 1330  BP: 112/78  Pulse: 42  Temp:   Resp: 17     Marlon Pel, PA-C 12/11/14 1337  Melene Plan, DO 12/11/14 1440

## 2014-12-11 NOTE — ED Notes (Signed)
Dr. Antoine Poche at the bedside.

## 2014-12-12 ENCOUNTER — Observation Stay (HOSPITAL_COMMUNITY): Payer: Self-pay

## 2014-12-12 ENCOUNTER — Encounter (HOSPITAL_COMMUNITY): Payer: Self-pay | Admitting: General Practice

## 2014-12-12 DIAGNOSIS — R079 Chest pain, unspecified: Secondary | ICD-10-CM

## 2014-12-12 LAB — COMPREHENSIVE METABOLIC PANEL
ALK PHOS: 37 U/L — AB (ref 38–126)
ALT: 13 U/L — ABNORMAL LOW (ref 17–63)
ANION GAP: 8 (ref 5–15)
AST: 17 U/L (ref 15–41)
Albumin: 2.6 g/dL — ABNORMAL LOW (ref 3.5–5.0)
BILIRUBIN TOTAL: 0.4 mg/dL (ref 0.3–1.2)
BUN: 8 mg/dL (ref 6–20)
CALCIUM: 8.6 mg/dL — AB (ref 8.9–10.3)
CO2: 30 mmol/L (ref 22–32)
Chloride: 103 mmol/L (ref 101–111)
Creatinine, Ser: 0.93 mg/dL (ref 0.61–1.24)
GFR calc non Af Amer: >60 mL/min (ref >60)
Glucose, Bld: 98 mg/dL (ref 65–99)
POTASSIUM: 3.9 mmol/L (ref 3.5–5.1)
SODIUM: 141 mmol/L (ref 135–145)
TOTAL PROTEIN: 4.9 g/dL — AB (ref 6.5–8.1)

## 2014-12-12 LAB — LIPID PANEL
Cholesterol: 153 mg/dL (ref 0–200)
HDL: 47 mg/dL (ref >40)
LDL CALC: 81 mg/dL (ref 0–99)
TRIGLYCERIDES: 125 mg/dL (ref <150)
Total CHOL/HDL Ratio: 3.3 RATIO
VLDL: 25 mg/dL (ref 0–40)

## 2014-12-12 LAB — PROTIME-INR
INR: 1.02 (ref 0.00–1.49)
Prothrombin Time: 13.7 seconds (ref 11.6–15.2)

## 2014-12-12 LAB — CBC
HEMATOCRIT: 37.9 % — AB (ref 39.0–52.0)
Hemoglobin: 12.5 g/dL — ABNORMAL LOW (ref 13.0–17.0)
MCH: 29.3 pg (ref 26.0–34.0)
MCHC: 33 g/dL (ref 30.0–36.0)
MCV: 88.8 fL (ref 78.0–100.0)
PLATELETS: 271 10*3/uL (ref 150–400)
RBC: 4.27 MIL/uL (ref 4.22–5.81)
RDW: 14 % (ref 11.5–15.5)
WBC: 6 10*3/uL (ref 4.0–10.5)

## 2014-12-12 LAB — HEMOGLOBIN A1C
HEMOGLOBIN A1C: 5.6 % (ref 4.8–5.6)
MEAN PLASMA GLUCOSE: 114 mg/dL

## 2014-12-12 LAB — TROPONIN I

## 2014-12-12 NOTE — Progress Notes (Signed)
Onalee HuaAlvarez MD on call for Cardiology returned page and was updated pertaining to patient's report of blood in stool prior to admission.  No new orders received at this time.  Will pass on to day shift RN.

## 2014-12-12 NOTE — Care Management Note (Signed)
Case Management Note  Patient Details  Name: Jeremy IvanoffDaniel J Hawkins MRN: [id number] Date of Birth: 1985/05/26  Subjective/Objective:     Pt admitted for cp. Pt is without insurance and PCP.                Action/Plan: CM did [facility]. Appointment placed on AVS. No further needs from CM at this time.   Gala LewandowskyGraves-Bigelow, Aristotle Lieb Kaye, RN 12/12/2014, 2:39 PM

## 2014-12-12 NOTE — Progress Notes (Signed)
Patient reported to RN that he had seen blood in his stool with his last bowel movement.  Last bowel movement per patient was prior to admission, cardiology text paged with this information.

## 2014-12-12 NOTE — Discharge Summary (Signed)
Discharge Summary   Patient ID: Jeremy Hawkins,  MRN: [id number], DOB/AGE: October 15, 1985 29 y.o.  Admit date: 12/11/2014 Discharge date: 12/12/2014  Primary Care Provider: No primary care provider on file. Primary Cardiologist:  None  Discharge Diagnoses Active Problems:   Chest pain   Syncope   Tobacco abuse   Hx of Cocaine abuse   Allergies No Known Allergies  Procedures  GXT 12/12/14 Study Highlights     There was no ST segment deviation noted during stress.  No T wave inversion was noted during stress.   History of Present Illness  Jeremy Hawkins is a 29 y.o. male with a history of tobacco abuse and occasional cocaine use who [facility] with chest pain and syncopal episode.  He is a thin AA male with no past medical hx of DM, HTN, HLD, CVA.   Patient operates heavy machinery for work. He was in a work meeting yesterday ~11:30 am he developed left sternal border chest pain that was severe 10/10, he clutched his chest and became very lightheaded then had a syncopal episode, he described the pain as overwhelming and stabbing. No post ictal sx or loss of urine or stool. Co-workers report he did hit his head from a sitting position. Since then he has been having ongoing, intermittent CP that lasts a couple of minutes and then resolves, currently he is having CP that is 7/10 and a squeezing pressure that is assocaited with mild nausea. It is not worse supine and better leaning forward. Not worse with inspiration. He denies hx of chest pains. He has not had any associated diaphoresis or shortness of breath. He denies exertional sx. No palpitations, weakness, confusion, lower extremity swelling, orthopnea, or PND.   He ad/its to occasional cocaine use (~ 1x/mo) and the last time he used was 3 days ago but not yesterday. He also reports/being  stressed lately. He has been out of work, got out of a long relationship with his girlfriend and moved in with his  grandmother. He has had poor PO intake due to financial issues. Has been feeling weak. He presented to the hospital today due to weakness and ongoing chest pain. He said he just couldn't go to work as he was too weak. No family hx of CAD but his mother is deceased and he does not have a relationship with his father. His grandmother has HTN.   In ED, ECG with STE in II, III, AVF and V3-V6 c/w early repol. POC trop neg. Troponin I neg x1.   Hospital Course  He was admitted with atypical chest pain. Trop x 3 negative. 12/12/2014: Cholesterol 153; HDL 47; LDL Cholesterol 81; Triglycerides 125; VLDL 25. HgbA1c 5.6. TSH normal. Orthostatic vital signs normal. No chest pain. POET (Plain Old Exercise Treadmill) with normal response.  She has been seen by Dr. Claudie FishermanHochrine today and deemed ready for discharge home. All follow-up appointments have been scheduled. Discharge medications are listed below - Resume home meds. No new meds were added.   His syncope is likely due to vegal epsiode. He was advice to stop tobacco smoking and cocaine abuse. Established care with PCP.   Discharge Vitals Blood pressure 105/53, pulse 71, temperature 98.2 F (36.8 C), temperature source Oral, resp. rate 15, height 5\' 3"  (1.6 m), weight 115 lb 8 oz (52.39 kg), SpO2 100 %.  Filed Weights   12/11/14 0945 12/11/14 1636 12/12/14 0500  Weight: 120 lb (54.432 kg) 114 lb 1.6 oz (51.755 kg)  115 lb 8 oz (52.39 kg)    Labs  CBC  Recent Labs  12/11/14 0947 12/11/14 1435 12/12/14 0218  WBC 5.9 6.5 6.0  NEUTROABS 1.6*  --   --   HGB 13.8 14.2 12.5*  HCT 40.5 41.5 37.9*  MCV 88.0 88.3 88.8  PLT 298 297 271   Basic Metabolic Panel  Recent Labs  12/11/14 0947 12/11/14 1435 12/12/14 0218  NA 140  --  141  K 3.6  --  3.9  CL 103  --  103  CO2 28  --  30  GLUCOSE 96  --  98  BUN <5*  --  8  CREATININE 0.84 0.88 0.93  CALCIUM 8.3*  --  8.6*  MG  --  1.9  --    Liver Function Tests  Recent Labs  12/12/14 0218    AST 17  ALT 13*  ALKPHOS 37*  BILITOT 0.4  PROT 4.9*  ALBUMIN 2.6*   No results for input(s): LIPASE, AMYLASE in the last 72 hours. Cardiac Enzymes  Recent Labs  12/11/14 1435 12/11/14 2031 12/12/14 0218  TROPONINI <0.03 <0.03 <0.03   Hemoglobin A1C  Recent Labs  12/11/14 1435  HGBA1C 5.6   Fasting Lipid Panel  Recent Labs  12/12/14 0218  CHOL 153  HDL 47  LDLCALC 81  TRIG 125  CHOLHDL 3.3   Thyroid Function Tests  Recent Labs  12/11/14 1435  TSH 3.696    Disposition  Pt is being discharged home today in good condition.  Follow-up Plans & Appointments  Follow-up Information    Follow up with PCP. Schedule an appointment as soon as possible for a visit in 3 weeks.   Contact information:   Find PCP and establish care for post hospital f/u      Discharge Instructions    Diet - low sodium heart healthy    Complete by:  As directed      Discharge instructions    Complete by:  As directed   Stop tobacco smoking and cocaine usage. Establish care with PCP.     Increase activity slowly    Complete by:  As directed            F/u Labs/Studies: None  Discharge Medications    Medication List    TAKE these medications        HYDROcodone-acetaminophen 5-325 MG tablet  Commonly known as:  NORCO/VICODIN  Take 1-2 tablets by mouth every 4 (four) hours as needed for moderate pain or severe pain.     ibuprofen 400 MG tablet  Commonly known as:  ADVIL,MOTRIN  Take 1 tablet (400 mg total) by mouth every 6 (six) hours as needed for pain.     methocarbamol 500 MG tablet  Commonly known as:  ROBAXIN  Take 1 tablet (500 mg total) by mouth 2 (two) times daily.        Duration of Discharge Encounter   Greater than 30 minutes including physician time.  Lorelei Pont PA-C 12/12/2014, 2:34 PM    Patient seen and examined.  Plan as discussed in my rounding note for today and outlined above. Negative adequate POET (Plain Old  Exercise Treadmill).  I reviewed this.  Plan as above.  [name] [facility]  12/12/2014  3:13 PM

## 2014-12-12 NOTE — Progress Notes (Signed)
The patient presented for POET (Plain Old Exercise Treadmill). Test stopped prior to get target HR due to intermittent diziness and bilateral calf pain. MD to review result.  Deveon Kisiel, PAC

## 2014-12-12 NOTE — Progress Notes (Signed)
SUBJECTIVE:  No chest pain.  No presyncope.   PHYSICAL EXAM Filed Vitals:   12/11/14 2005 12/11/14 2012 12/11/14 2029 12/12/14 0500  BP: 116/70 108/62 118/52 102/54  Pulse: 55 58 52 68  Temp:  98.3 F (36.8 C)  97.8 F (36.6 C)  TempSrc:  Oral  Oral  Resp:  18  20  Height:      Weight:    115 lb 8 oz (52.39 kg)  SpO2:  100%  100%   General:  No distress Lungs:  Clear Heart:  RRR Abdomen:  Positive bowel sounds, no rebound no guarding Extremities:  No edema  LABS: Lab Results  Component Value Date   TROPONINI <0.03 12/12/2014   Results for orders placed or performed during the hospital encounter of 12/11/14 (from the past 24 hour(s))  CBC with Differential/Platelet     Status: Abnormal   Collection Time: 12/11/14  9:47 AM  Result Value Ref Range   WBC 5.9 4.0 - 10.5 K/uL   RBC 4.60 4.22 - 5.81 MIL/uL   Hemoglobin 13.8 13.0 - 17.0 g/dL   HCT 16.140.5 09.639.0 - 04.552.0 %   MCV 88.0 78.0 - 100.0 fL   MCH 30.0 26.0 - 34.0 pg   MCHC 34.1 30.0 - 36.0 g/dL   RDW 40.914.0 81.111.5 - 91.415.5 %   Platelets 298 150 - 400 K/uL   Neutrophils Relative % 26 %   Neutro Abs 1.6 (L) 1.7 - 7.7 K/uL   Lymphocytes Relative 49 %   Lymphs Abs 2.8 0.7 - 4.0 K/uL   Monocytes Relative 8 %   Monocytes Absolute 0.5 0.1 - 1.0 K/uL   Eosinophils Relative 16 %   Eosinophils Absolute 1.0 (H) 0.0 - 0.7 K/uL   Basophils Relative 1 %   Basophils Absolute 0.1 0.0 - 0.1 K/uL  Basic metabolic panel     Status: Abnormal   Collection Time: 12/11/14  9:47 AM  Result Value Ref Range   Sodium 140 135 - 145 mmol/L   Potassium 3.6 3.5 - 5.1 mmol/L   Chloride 103 101 - 111 mmol/L   CO2 28 22 - 32 mmol/L   Glucose, Bld 96 65 - 99 mg/dL   BUN <5 (L) 6 - 20 mg/dL   Creatinine, Ser 7.820.84 0.61 - 1.24 mg/dL   Calcium 8.3 (L) 8.9 - 10.3 mg/dL   GFR calc non Af Amer >60 >60 mL/min   GFR calc Af Amer >60 >60 mL/min   Anion gap 9 5 - 15  Troponin I     Status: None   Collection Time: 12/11/14  9:47 AM  Result Value Ref  Range   Troponin I <0.03 <0.031 ng/mL  I-stat troponin, ED     Status: None   Collection Time: 12/11/14  9:54 AM  Result Value Ref Range   Troponin i, poc 0.00 0.00 - 0.08 ng/mL   Comment 3          POC CBG, ED     Status: None   Collection Time: 12/11/14  9:55 AM  Result Value Ref Range   Glucose-Capillary 76 65 - 99 mg/dL  Protime-INR     Status: None   Collection Time: 12/11/14  2:35 PM  Result Value Ref Range   Prothrombin Time 13.6 11.6 - 15.2 seconds   INR 1.02 0.00 - 1.49  CBC     Status: None   Collection Time: 12/11/14  2:35 PM  Result Value Ref Range   WBC  6.5 4.0 - 10.5 K/uL   RBC 4.70 4.22 - 5.81 MIL/uL   Hemoglobin 14.2 13.0 - 17.0 g/dL   HCT 16.1 09.6 - 04.5 %   MCV 88.3 78.0 - 100.0 fL   MCH 30.2 26.0 - 34.0 pg   MCHC 34.2 30.0 - 36.0 g/dL   RDW 40.9 81.1 - 91.4 %   Platelets 297 150 - 400 K/uL  Creatinine, serum     Status: None   Collection Time: 12/11/14  2:35 PM  Result Value Ref Range   Creatinine, Ser 0.88 0.61 - 1.24 mg/dL   GFR calc non Af Amer >60 >60 mL/min   GFR calc Af Amer >60 >60 mL/min  Hemoglobin A1c     Status: None   Collection Time: 12/11/14  2:35 PM  Result Value Ref Range   Hgb A1c MFr Bld 5.6 4.8 - 5.6 %   Mean Plasma Glucose 114 mg/dL  Troponin I     Status: None   Collection Time: 12/11/14  2:35 PM  Result Value Ref Range   Troponin I <0.03 <0.031 ng/mL  TSH     Status: None   Collection Time: 12/11/14  2:35 PM  Result Value Ref Range   TSH 3.696 0.350 - 4.500 uIU/mL  Magnesium     Status: None   Collection Time: 12/11/14  2:35 PM  Result Value Ref Range   Magnesium 1.9 1.7 - 2.4 mg/dL  Troponin I     Status: None   Collection Time: 12/11/14  8:31 PM  Result Value Ref Range   Troponin I <0.03 <0.031 ng/mL  Troponin I     Status: None   Collection Time: 12/12/14  2:18 AM  Result Value Ref Range   Troponin I <0.03 <0.031 ng/mL  Comprehensive metabolic panel     Status: Abnormal   Collection Time: 12/12/14  2:18 AM    Result Value Ref Range   Sodium 141 135 - 145 mmol/L   Potassium 3.9 3.5 - 5.1 mmol/L   Chloride 103 101 - 111 mmol/L   CO2 30 22 - 32 mmol/L   Glucose, Bld 98 65 - 99 mg/dL   BUN 8 6 - 20 mg/dL   Creatinine, Ser 7.82 0.61 - 1.24 mg/dL   Calcium 8.6 (L) 8.9 - 10.3 mg/dL   Total Protein 4.9 (L) 6.5 - 8.1 g/dL   Albumin 2.6 (L) 3.5 - 5.0 g/dL   AST 17 15 - 41 U/L   ALT 13 (L) 17 - 63 U/L   Alkaline Phosphatase 37 (L) 38 - 126 U/L   Total Bilirubin 0.4 0.3 - 1.2 mg/dL   GFR calc non Af Amer >60 >60 mL/min   GFR calc Af Amer >60 >60 mL/min   Anion gap 8 5 - 15  CBC     Status: Abnormal   Collection Time: 12/12/14  2:18 AM  Result Value Ref Range   WBC 6.0 4.0 - 10.5 K/uL   RBC 4.27 4.22 - 5.81 MIL/uL   Hemoglobin 12.5 (L) 13.0 - 17.0 g/dL   HCT 95.6 (L) 21.3 - 08.6 %   MCV 88.8 78.0 - 100.0 fL   MCH 29.3 26.0 - 34.0 pg   MCHC 33.0 30.0 - 36.0 g/dL   RDW 57.8 46.9 - 62.9 %   Platelets 271 150 - 400 K/uL  Protime-INR     Status: None   Collection Time: 12/12/14  2:18 AM  Result Value Ref Range   Prothrombin Time 13.7 11.6 -  15.2 seconds   INR 1.02 0.00 - 1.49  Lipid panel     Status: None   Collection Time: 12/12/14  2:18 AM  Result Value Ref Range   Cholesterol 153 0 - 200 mg/dL   Triglycerides 960 <454 mg/dL   HDL 47 >09 mg/dL   Total CHOL/HDL Ratio 3.3 RATIO   VLDL 25 0 - 40 mg/dL   LDL Cholesterol 81 0 - 99 mg/dL    Intake/Output Summary (Last 24 hours) at 12/12/14 0856 Last data filed at 12/11/14 2200  Gross per 24 hour  Intake    600 ml  Output    275 ml  Net    325 ml    EKG:  Sinus bradycardia, rate 47, early repolarization pattern.  No acute changes.  12/12/2014  ASSESSMENT AND PLAN:  CHEST PAIN:  Negative cardiac enzymes.   POET (Plain Old Exercise Treadmill) today.   SYNCOPE:   No evidence of arrhythmia.  OK to discharge today if POET (Plain Old Exercise Treadmill) OK.  I do not think further monitoring is indicated.  I suspect a vagal episode.    [name] [facility] 12/12/2014 8:56 AM

## 2014-12-19 ENCOUNTER — Inpatient Hospital Stay: Payer: Self-pay | Admitting: Family Medicine

## 2015-04-06 ENCOUNTER — Emergency Department (HOSPITAL_COMMUNITY)
Admission: EM | Admit: 2015-04-06 | Discharge: 2015-04-06 | Disposition: A | Discharge status: Home / Self Care | Payer: MEDICAID | Attending: Emergency Medicine | Admitting: Emergency Medicine

## 2015-04-06 ENCOUNTER — Encounter (HOSPITAL_COMMUNITY): Payer: Self-pay | Admitting: Emergency Medicine

## 2015-04-06 DIAGNOSIS — R531 Weakness: Secondary | ICD-10-CM

## 2015-04-06 DIAGNOSIS — F17210 Nicotine dependence, cigarettes, uncomplicated: Secondary | ICD-10-CM | POA: Insufficient documentation

## 2015-04-06 DIAGNOSIS — F1410 Cocaine abuse, uncomplicated: Secondary | ICD-10-CM | POA: Insufficient documentation

## 2015-04-06 DIAGNOSIS — F1010 Alcohol abuse, uncomplicated: Secondary | ICD-10-CM | POA: Insufficient documentation
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
## 2015-04-06 NOTE — Discharge Instructions (Signed)

## 2015-04-06 NOTE — ED Notes (Signed)
[place] sandwich and apple juice completed.
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
## 2015-04-06 NOTE — ED Notes (Signed)
Pt given a turkey sandwich and apple juice 

## 2015-04-08 LAB — EXERCISE TOLERANCE TEST
CHL CUP MPHR: 189 {beats}/min
CSEPHR: 64 %
CSEPPHR: 122 {beats}/min
Estimated workload: 10 METS
Exercise duration (min): 7 min
Exercise duration (sec): 59 s
RPE: 17
Rest HR: 52 {beats}/min

## 2015-04-11 ENCOUNTER — Encounter (HOSPITAL_COMMUNITY): Payer: Self-pay

## 2015-04-11 ENCOUNTER — Emergency Department (INDEPENDENT_AMBULATORY_CARE_PROVIDER_SITE_OTHER)
Admission: EM | Admit: 2015-04-11 | Discharge: 2015-04-11 | Disposition: A | Discharge status: Home / Self Care | Payer: Self-pay | Source: Home / Self Care | Attending: Family Medicine | Admitting: Family Medicine

## 2015-04-11 DIAGNOSIS — R69 Illness, unspecified: Principal | ICD-10-CM

## 2015-04-11 DIAGNOSIS — J111 Influenza due to unidentified influenza virus with other respiratory manifestations: Secondary | ICD-10-CM

## 2015-04-11 MED ORDER — IBUPROFEN 600 MG PO TABS: 600.0000 mg | ORAL_TABLET | Freq: Four times a day (QID) | ORAL | Status: AC | PRN

## 2015-04-11 NOTE — Discharge Instructions (Signed)
It was a pleasure to see you today. I believe you have the flu.   Supportive care including ibuprofen  tablets, take 1 tablet by mouth every 6 hours as needed with something to eat. Plenty of fluids.    Influenza, Adult Influenza ("the flu") is a viral infection of the respiratory tract. It occurs more often in winter months because people spend more time in close contact with one another. Influenza can make you feel very sick. Influenza easily spreads from person to person (contagious). CAUSES  Influenza is caused by a virus that infects the respiratory tract. You can catch the virus by breathing in droplets from an infected person's cough or sneeze. You can also catch the virus by touching something that was recently contaminated with the virus and then touching your mouth, nose, or eyes. RISKS AND COMPLICATIONS You may be at risk for a more severe case of influenza if you smoke cigarettes, have diabetes, have chronic heart disease (such as heart failure) or lung disease (such as asthma), or if you have a weakened immune system. Elderly people and pregnant women are also at risk for more serious infections. The most common problem of influenza is a lung infection (pneumonia). Sometimes, this problem can require emergency medical care and may be life threatening. SIGNS AND SYMPTOMS  Symptoms typically last 4 to 10 days and may include:  Fever.  Chills.  Headache, body aches, and muscle aches.  Sore throat.  Chest discomfort and cough.  Poor appetite.  Weakness or feeling tired.  Dizziness.  Nausea or vomiting. DIAGNOSIS  Diagnosis of influenza is often made based on your history and a physical exam. A nose or throat swab test can be done to confirm the diagnosis. TREATMENT  In mild cases, influenza goes away on its own. Treatment is directed at relieving symptoms. For more severe cases, your health care provider may prescribe antiviral medicines to shorten the sickness.  Antibiotic medicines are not effective because the infection is caused by a virus, not by bacteria. HOME CARE INSTRUCTIONS  Take medicines only as directed by your health care provider.  Use a cool mist humidifier to make breathing easier.  Get plenty of rest until your temperature returns to normal. This usually takes 3 to 4 days.  Drink enough fluid to keep your urine clear or pale yellow.  Cover yourmouth and nosewhen coughing or sneezing,and wash your handswellto prevent thevirusfrom spreading.  Stay homefromwork orschool untilthe fever is gonefor at least 58full day. PREVENTION  An annual influenza vaccination (flu shot) is the best way to avoid getting influenza. An annual flu shot is now routinely recommended for all adults in the [place] SEEK MEDICAL CARE IF:  You experiencechest pain, yourcough worsens,or you producemore mucus.  Youhave nausea,vomiting, ordiarrhea.  Your fever returns or gets worse. SEEK IMMEDIATE MEDICAL CARE IF:  You havetrouble breathing, you become short of breath,or your skin ornails becomebluish.  You have severe painor stiffnessin the neck.  You develop a sudden headache, or pain in the face or ear.  You have nausea or vomiting that you cannot control. MAKE SURE YOU:   Understand these instructions.  Will watch your condition.  Will get help right away if you are not doing well or get worse.   This information is not intended to replace advice given to you by your health care provider. Make sure you discuss any questions you have with your health care provider.   Document Released: 02/14/2000 Document Revised: 03/09/2014 Document Reviewed: 05/18/2011  Elsevier Interactive Patient Education ©2016 [employer] ° °

## 2015-04-11 NOTE — ED Provider Notes (Signed)
CSN: [id number]     Arrival date & time 04/11/15  1441 History   First MD Initiated Contact with Patient 04/11/15 1614     Chief Complaint  Patient presents with  . Generalized Body Aches  . Cough   (Consider location/radiation/quality/duration/timing/severity/associated sxs/prior Treatment) Patient is a 30 y.o. male presenting with cough. The history is provided by the patient. No language interpreter was used.  Cough Associated symptoms: chills, diaphoresis, fever, headaches and rhinorrhea   Pt with complaint of sudden onset body aches, headache and cough/sneeze on Monday, Feb 6th.  Entire family started on the same day with same symptoms. Did not get flu shot this year. Henrene Dodge has been out of school all week. Reports mild improvement today. Has felt hot but did not check temp (subjective fevers).  No medicines for this episode of illness.   PMHx none. Takes no chronic medications. Reports no medication allergies.    Past Medical History  Diagnosis Date  . Tobacco abuse   . Polysubstance abuse     a. occasionally uses cocaine.   Past Surgical History  Procedure Laterality Date  . Lip repair     History reviewed. No pertinent family history. Social History  Substance Use Topics  . Smoking status: Current Every Day Smoker -- 0.25 packs/day for 13 years    Types: Cigarettes  . Smokeless tobacco: Never Used  . Alcohol Use: 3.6 oz/week    6 Cans of beer per week    Review of Systems  Constitutional: Positive for fever, chills, diaphoresis and fatigue. Negative for activity change and appetite change.  HENT: Positive for rhinorrhea and sneezing. Negative for congestion and sinus pressure.   Respiratory: Positive for cough.   Neurological: Positive for headaches.  All other systems reviewed and are negative.   Allergies  Review of patient's allergies indicates no known allergies.  Home Medications   Prior to Admission medications   Not on File   Meds Ordered and  Administered this Visit  Medications - No data to display  BP 110/80 mmHg  Pulse 88  Temp(Src) 101.6 F (38.7 C) (Oral)  Resp 20  SpO2 97% No data found.   Physical Exam  Constitutional: He appears well-developed and well-nourished. No distress.  HENT:  Head: Normocephalic and atraumatic.  Right Ear: External ear normal.  Left Ear: External ear normal.  Nose: Nose normal.  Mouth/Throat: Oropharynx is clear and moist. No oropharyngeal exudate.  Eyes: EOM are normal. Pupils are equal, round, and reactive to light. Right eye exhibits no discharge. Left eye exhibits no discharge.  Injected conjunctivae  Neck: Normal range of motion. Neck supple. No tracheal deviation present. No thyromegaly present.  Cardiovascular: Normal rate and regular rhythm.  Exam reveals no friction rub.   No murmur heard. Pulmonary/Chest: Effort normal and breath sounds normal. No respiratory distress. He has no wheezes. He has no rales. He exhibits no tenderness.  Abdominal: Soft. Bowel sounds are normal.  Lymphadenopathy:    He has no cervical adenopathy.  Skin: He is not diaphoretic.    ED Course  Procedures (including critical care time)  Labs Review Labs Reviewed - No data to display  Imaging Review No results found.   Visual Acuity Review  Right Eye Distance:   Left Eye Distance:   Bilateral Distance:    Right Eye Near:   Left Eye Near:    Bilateral Near:         MDM  No diagnosis found. ILI, supportive management. Discussed indications  for further evaluation.   Paula Compton, MD     Barbaraann Barthel, MD 04/12/15 [phone]

## 2015-04-11 NOTE — ED Notes (Signed)
Pt stated that he has had cough,body aches,sneezing and SOB for 3 days Pt alert and oriented

## 2016-02-04 ENCOUNTER — Ambulatory Visit (HOSPITAL_COMMUNITY)
Admission: EM | Admit: 2016-02-04 | Discharge: 2016-02-04 | Disposition: A | Discharge status: Home / Self Care | Payer: Self-pay | Attending: Family Medicine | Admitting: Family Medicine

## 2016-02-04 ENCOUNTER — Encounter (HOSPITAL_COMMUNITY): Payer: Self-pay | Admitting: Family Medicine

## 2016-02-04 DIAGNOSIS — K029 Dental caries, unspecified: Secondary | ICD-10-CM

## 2016-02-04 DIAGNOSIS — K0889 Other specified disorders of teeth and supporting structures: Secondary | ICD-10-CM

## 2016-02-04 MED ORDER — NAPROXEN 500 MG PO TABS: 500.0000 mg | ORAL_TABLET | Freq: Two times a day (BID) | ORAL | 0 refills | Status: AC

## 2016-02-04 MED ORDER — AMOXICILLIN 875 MG PO TABS
875.0000 mg | ORAL_TABLET | Freq: Two times a day (BID) | ORAL | 0 refills | Status: DC
Start: 2016-02-04 — End: 2016-02-04

## 2016-02-04 MED ORDER — AMOXICILLIN 875 MG PO TABS: 875.0000 mg | ORAL_TABLET | Freq: Two times a day (BID) | ORAL | 0 refills | Status: AC

## 2016-02-04 NOTE — ED Triage Notes (Signed)
Pt here for a few broken teeth and needing a filling and referrel to dentist.

## 2016-02-04 NOTE — ED Provider Notes (Signed)
CSN: [id number]     Arrival date & time 02/04/16  1012 History   None    Chief Complaint  Patient presents with  . Dental Pain   (Consider location/radiation/quality/duration/timing/severity/associated sxs/prior Treatment) Patient c/o right upper back tooth pain and cavity.  He does not have a dentist.   The history is provided by the patient.  Dental Pain  Location:  Upper Upper teeth location:  1/RU 3rd molar Quality:  Aching Severity:  Moderate Onset quality:  Gradual Duration:  1 week Timing:  Constant Progression:  Worsening Chronicity:  New Context: dental caries   Relieved by:  None tried Ineffective treatments:  None tried   Past Medical History:  Diagnosis Date  . Polysubstance abuse    a. occasionally uses cocaine.  . Tobacco abuse    Past Surgical History:  Procedure Laterality Date  . LIP REPAIR     History reviewed. No pertinent family history. Social History  Substance Use Topics  . Smoking status: Current Every Day Smoker    Packs/day: 0.25    Years: 13.00    Types: Cigarettes  . Smokeless tobacco: Never Used  . Alcohol use 3.6 oz/week    6 Cans of beer per week    Review of Systems  Constitutional: Negative.   HENT: Positive for dental problem.   Eyes: Negative.   Respiratory: Negative.   Cardiovascular: Negative.   Gastrointestinal: Negative.   Endocrine: Negative.   Genitourinary: Negative.   Musculoskeletal: Negative.   Allergic/Immunologic: Negative.   Neurological: Negative.   Hematological: Negative.   Psychiatric/Behavioral: Negative.     Allergies  Patient has no known allergies.  Home Medications   Prior to Admission medications   Medication Sig Start Date End Date Taking? Authorizing Provider  amoxicillin (AMOXIL) 875 MG tablet Take 1 tablet (875 mg total) by mouth 2 (two) times daily. 02/04/16   Deatra CanterWilliam J Oxford, FNP  ibuprofen (ADVIL,MOTRIN) 600 MG tablet Take 1 tablet (600 mg total) by mouth every 6 (six) hours as  needed. 04/11/15   Barbaraann BarthelJames O Breen, MD  naproxen (NAPROSYN) 500 MG tablet Take 1 tablet (500 mg total) by mouth 2 (two) times daily with a meal. 02/04/16   Deatra CanterWilliam J Oxford, FNP   Meds Ordered and Administered this Visit  Medications - No data to display  BP 117/61   Pulse 62   Temp 98.2 F (36.8 C)   Resp 18   SpO2 98%  No data found.   Physical Exam  Constitutional: He appears well-developed and well-nourished.  HENT:  Head: Normocephalic and atraumatic.  Right upper back molar with dental carie and tenderness to percusssion  Eyes: Conjunctivae and EOM are normal. Pupils are equal, round, and reactive to light.  Neck: Normal range of motion. Neck supple.  Cardiovascular: Normal rate, regular rhythm and normal heart sounds.   Pulmonary/Chest: Effort normal and breath sounds normal.  Abdominal: Soft. Bowel sounds are normal.  Nursing note and vitals reviewed.   Urgent Care Course   Clinical Course     Procedures (including critical care time)  Labs Review Labs Reviewed - No data to display  Imaging Review No results found.   Visual Acuity Review  Right Eye Distance:   Left Eye Distance:   Bilateral Distance:    Right Eye Near:   Left Eye Near:    Bilateral Near:         MDM  1. Dental Pain 2. Dental Carie  Advised patient to take tylenol otc as  directed for pain and also he may use oragel for dental pain. Amoxicillin 875mg  one po bid x 10 days #20 Naprosyn 500mg  one po bidx 10 days #20 Patient given dentist name on DC papers and phone number to follow up with.     Deatra CanterWilliam J Oxford, FNP 02/04/16 1124

## 2016-03-16 ENCOUNTER — Ambulatory Visit (HOSPITAL_COMMUNITY)
Admission: EM | Admit: 2016-03-16 | Discharge: 2016-03-16 | Disposition: A | Discharge status: Home / Self Care | Payer: Self-pay | Attending: Family Medicine | Admitting: Family Medicine

## 2016-03-16 ENCOUNTER — Encounter (HOSPITAL_COMMUNITY): Payer: Self-pay | Admitting: *Deleted

## 2016-03-16 DIAGNOSIS — K0889 Other specified disorders of teeth and supporting structures: Secondary | ICD-10-CM

## 2016-03-16 NOTE — Discharge Instructions (Signed)
Finish meds and see dentist when possible.

## 2016-03-16 NOTE — ED Triage Notes (Signed)
Pt  Reports   Has   A  Hole    In  His  r   Upper  Molar   sentances              He   Was   Seen   approx  5  Weeks   Ago       Was  Unable to   See    A   Dentist

## 2016-03-16 NOTE — ED Provider Notes (Signed)
[facility]    CSN: [id number] Arrival date & time: 03/16/16  1414     History   Chief Complaint Chief Complaint  Patient presents with  . Dental Problem    HPI Jeremy Hawkins is a 31 y.o. male.   The history is provided by the patient and the spouse.  Dental Pain  Location:  Upper Upper teeth location:  2/RU 2nd molar Quality:  Throbbing Severity:  Moderate Onset quality:  Gradual Duration:  5 weeks Progression:  Unchanged Chronicity:  Recurrent Context: enamel fracture   Context: normal dentition   Relieved by:  None tried Worsened by:  Nothing Ineffective treatments:  None tried   Past Medical History:  Diagnosis Date  . Polysubstance abuse    a. occasionally uses cocaine.  . Tobacco abuse     Patient Active Problem List   Diagnosis Date Noted  . Chest pain 12/11/2014  . Syncope 12/11/2014  . Tobacco abuse 12/11/2014  . Polysubstance abuse   . ATTENTION DEFICIT DISORDER, HX OF 07/23/2009    Past Surgical History:  Procedure Laterality Date  . LIP REPAIR         Home Medications    Prior to Admission medications   Medication Sig Start Date End Date Taking? Authorizing Provider  amoxicillin (AMOXIL) 875 MG tablet Take 1 tablet (875 mg total) by mouth 2 (two) times daily. 02/04/16   Deatra Canter, FNP  ibuprofen (ADVIL,MOTRIN) 600 MG tablet Take 1 tablet (600 mg total) by mouth every 6 (six) hours as needed. 04/11/15   Barbaraann Barthel, MD  naproxen (NAPROSYN) 500 MG tablet Take 1 tablet (500 mg total) by mouth 2 (two) times daily with a meal. 02/04/16   Deatra Canter, FNP    Family History History reviewed. No pertinent family history.  Social History Social History  Substance Use Topics  . Smoking status: Current Every Day Smoker    Packs/day: 0.25    Years: 13.00    Types: Cigarettes  . Smokeless tobacco: Never Used  . Alcohol use 3.6 oz/week    6 Cans of beer per week     Allergies   Patient has no known  allergies.   Review of Systems Review of Systems  HENT: Positive for dental problem.   All other systems reviewed and are negative.    Physical Exam Triage Vital Signs ED Triage Vitals  Enc Vitals Group     BP 03/16/16 1538 130/70     Pulse Rate 03/16/16 1538 78     Resp 03/16/16 1538 18     Temp 03/16/16 1538 98.6 F (37 C)     Temp Source 03/16/16 1538 Oral     SpO2 03/16/16 1538 100 %     Weight --      Height --      Head Circumference --      Peak Flow --      Pain Score 03/16/16 1537 10     Pain Loc --      Pain Edu? --      Excl. in GC? --    No data found.   Updated Vital Signs BP 130/70 (BP Location: Right Arm)   Pulse 78   Temp 98.6 F (37 C) (Oral)   Resp 18   SpO2 100%   Visual Acuity Right Eye Distance:   Left Eye Distance:   Bilateral Distance:    Right Eye Near:   Left Eye Near:  Bilateral Near:     Physical Exam  Constitutional: He appears well-developed and well-nourished.  HENT:  Mouth/Throat: Oropharynx is clear and moist and mucous membranes are normal. Abnormal dentition.    Nursing note and vitals reviewed.    UC Treatments / Results  Labs (all labs ordered are listed, but only abnormal results are displayed) Labs Reviewed - No data to display  EKG  EKG Interpretation None       Radiology No results found.  Procedures Procedures (including critical care time)  Medications Ordered in UC Medications - No data to display   Initial Impression / Assessment and Plan / UC Course  I have reviewed the triage vital signs and the nursing notes.  Pertinent labs & imaging results that were available during my care of the patient were reviewed by me and considered in my medical decision making (see chart for details).  Clinical Course     Has not finished meds from prior ER visit.  Final Clinical Impressions(s) / UC Diagnoses   Final diagnoses:  Pain, dental    New Prescriptions New Prescriptions   No  medications on file     Linna HoffJames D Hawkin Charo, MD 03/31/16 2150

## 2017-11-05 ENCOUNTER — Other Ambulatory Visit: Payer: Self-pay

## 2017-11-05 ENCOUNTER — Emergency Department (HOSPITAL_COMMUNITY)
Admission: EM | Admit: 2017-11-05 | Discharge: 2017-11-05 | Disposition: A | Discharge status: Home / Self Care | Payer: Self-pay | Attending: Emergency Medicine | Admitting: Emergency Medicine

## 2017-11-05 ENCOUNTER — Emergency Department (HOSPITAL_COMMUNITY): Payer: Self-pay

## 2017-11-05 ENCOUNTER — Encounter (HOSPITAL_COMMUNITY): Payer: Self-pay

## 2017-11-05 DIAGNOSIS — W19XXXA Unspecified fall, initial encounter: Secondary | ICD-10-CM

## 2017-11-05 DIAGNOSIS — M25511 Pain in right shoulder: Secondary | ICD-10-CM | POA: Insufficient documentation

## 2017-11-05 DIAGNOSIS — M545 Low back pain, unspecified: Secondary | ICD-10-CM

## 2017-11-05 DIAGNOSIS — F17210 Nicotine dependence, cigarettes, uncomplicated: Secondary | ICD-10-CM | POA: Insufficient documentation

## 2017-11-05 MED ORDER — CYCLOBENZAPRINE HCL 5 MG PO TABS: 10.0000 mg | ORAL_TABLET | Freq: Two times a day (BID) | ORAL | 0 refills | Status: AC | PRN

## 2017-11-05 NOTE — ED Provider Notes (Signed)
[facility] EMERGENCY DEPARTMENT Provider Note  CSN: [id number] Arrival date & time: 11/05/17  1455   History   Chief Complaint Chief Complaint  Patient presents with  . Fall    HPI Jeremy Hawkins is a 32 y.o. male with no significant medical history who presented to the ED following a fall. Patient states that he fell on his back while helping with moving a dryer 3 days ago. Patient did not hit his head or have LOC. He describes that pain began immediately following the incident. He describes aching, bilateral low back pain that is worse with movement. He also has pain in his right shoulder blade. Denies neck pain, paresthesias, weakness, gait/coordination/balance issues, bowel or bladder incontinence.   Past Medical History:  Diagnosis Date  . Polysubstance abuse (HCC)    a. occasionally uses cocaine.  . Tobacco abuse     Patient Active Problem List   Diagnosis Date Noted  . Chest pain 12/11/2014  . Syncope 12/11/2014  . Tobacco abuse 12/11/2014  . Polysubstance abuse (HCC)   . ATTENTION DEFICIT DISORDER, HX OF 07/23/2009    Past Surgical History:  Procedure Laterality Date  . LIP REPAIR          Home Medications    Prior to Admission medications   Medication Sig Start Date End Date Taking? Authorizing Provider  amoxicillin (AMOXIL) 875 MG tablet Take 1 tablet (875 mg total) by mouth 2 (two) times daily. 02/04/16   Deatra Canter, FNP  cyclobenzaprine (FLEXERIL) 5 MG tablet Take 2 tablets (10 mg total) by mouth 2 (two) times daily as needed for up to 7 days for muscle spasms. 11/05/17 11/12/17  Mortis, Jerrel Ivory I, PA-C  ibuprofen (ADVIL,MOTRIN) 600 MG tablet Take 1 tablet (600 mg total) by mouth every 6 (six) hours as needed. 04/11/15   Barbaraann Barthel, MD  naproxen (NAPROSYN) 500 MG tablet Take 1 tablet (500 mg total) by mouth 2 (two) times daily with a meal. 02/04/16   Oxford, Anselm Pancoast, FNP    Family History No family history on  file.  Social History Social History   Tobacco Use  . Smoking status: Current Every Day Smoker    Packs/day: 0.25    Years: 13.00    Pack years: 3.25    Types: Cigarettes  . Smokeless tobacco: Never Used  Substance Use Topics  . Alcohol use: Yes    Alcohol/week: 6.0 standard drinks    Types: 6 Cans of beer per week  . Drug use: Not Currently     Allergies   Patient has no known allergies.   Review of Systems Review of Systems  Constitutional: Negative for chills and fever.  Gastrointestinal: Negative.   Genitourinary: Negative.   Musculoskeletal: Positive for arthralgias and back pain. Negative for gait problem and neck pain.  Skin: Negative.   Neurological: Negative for dizziness, weakness, numbness and headaches.  Hematological: Negative.   Psychiatric/Behavioral: Negative.      Physical Exam Updated Vital Signs BP 100/64 (BP Location: Left Arm)   Pulse (!) 50   Temp 98.7 F (37.1 C) (Oral)   Resp 12   Ht 5\' 3"  (1.6 m)   Wt 57.2 kg   SpO2 96%   BMI 22.32 kg/m   Physical Exam  Constitutional: Vital signs are normal. He appears well-developed and well-nourished. He is cooperative.  Neck: Normal range of motion and full passive range of motion without pain. Neck supple. No spinous process tenderness and no  muscular tenderness present. Normal range of motion present.  Pulmonary/Chest: He exhibits no tenderness.  Musculoskeletal: Normal range of motion.       Right shoulder: He exhibits tenderness. He exhibits normal range of motion, no bony tenderness, no deformity and no spasm.       Cervical back: Normal.       Thoracic back: Normal.       Lumbar back: He exhibits tenderness. He exhibits normal range of motion, no bony tenderness and no spasm.  Full active and passive ROM of upper and lower extremities with 5/5 strength. No midline tenderness of spine or bony tenderness of right shoulder structures. Bilateral muscular tenderness of lumbar paraspinal muscles.   Neurological: He is alert. He has normal strength. No sensory deficit. He exhibits normal muscle tone. Gait normal.  Reflex Scores:      Tricep reflexes are 2+ on the right side and 2+ on the left side.      Bicep reflexes are 2+ on the right side and 2+ on the left side.      Brachioradialis reflexes are 2+ on the right side and 2+ on the left side.      Patellar reflexes are 2+ on the right side and 2+ on the left side.      Achilles reflexes are 2+ on the right side and 2+ on the left side. Skin: Skin is warm. Capillary refill takes less than 2 seconds. No abrasion and no bruising noted.  Nursing note and vitals reviewed.  ED Treatments / Results  Labs (all labs ordered are listed, but only abnormal results are displayed) Labs Reviewed - No data to display  EKG EKG Interpretation  Date/Time:  Friday November 05 2017 17:43:30 EDT Ventricular Rate:  46 PR Interval:    QRS Duration: 92 QT Interval:  433 QTC Calculation: 379 R Axis:   86 Text Interpretation:  Marked sinus bradycardia unchanged from prior ekg of 12 Dec 2014 Confirmed by Margarita Grizzle [phone]) on 11/05/2017 5:48:21 PM   Radiology Dg Lumbar Spine Complete  Result Date: 11/05/2017 CLINICAL DATA:  Low back pain following a fall 3 days ago. EXAM: LUMBAR SPINE - COMPLETE 4+ VIEW COMPARISON:  None. FINDINGS: There is no evidence of lumbar spine fracture. Alignment is normal. Intervertebral disc spaces are maintained. IMPRESSION: Normal examination. Electronically Signed   By: Beckie Salts M.D.   On: 11/05/2017 17:07   Dg Scapula Right  Result Date: 11/05/2017 CLINICAL DATA:  Right scapular pain following a fall 3 days ago. EXAM: RIGHT SCAPULA - 2+ VIEWS COMPARISON:  Portable chest dated 12/11/2014. FINDINGS: There is no evidence of fracture or other focal bone lesions. Soft tissues are unremarkable. IMPRESSION: Normal examination. Electronically Signed   By: Beckie Salts M.D.   On: 11/05/2017 17:06     Procedures Procedures (including critical care time)  Medications Ordered in ED Medications - No data to display   Initial Impression / Assessment and Plan / ED Course  Triage vital signs and the nursing notes have been reviewed.  Pertinent labs & imaging results that were available during care of the patient were reviewed and considered in medical decision making (see chart for details).  Patient presents 3 days following a mechanical fall where he landed on his back. He currently endorses bilateral low back pain and right shoulder pain. He is able to ambulate on her own and does so without assistance or issue. Patient has full sensation in upper and lower extremities bilaterally with full  active and passive ROM without pain. No deformities, decreased muscle tone or other abnormalities visualized. Neurovascular function is intact. Physical exam and x-rays are reassuring. There are no other physical exam findings or s/s that suggest an underlying infectious or rheumatologic process that warrant further evaluation or intervention today. There is no midline tenderness, deformities or abnormal neuro findings on exam or in history to suggest an acute spinal cord or osseus pathology that warrant additional imaging today.  Clinical Course as of Nov 07 1451  Fri Nov 05, 2017  1715 Lumbar spine and right shoulder x-rays normal.   [GM]    Clinical Course User Index [GM] Windy Carina, PA-C    Final Clinical Impressions(s) / ED Diagnoses  1. Fall. 2. Acute Bilateral Low Back Pain. Rx for Flexeril for muscle spasms. Education provided on OTC and supportive treatment for relief. Advised to follow-up with PCP if pain continues to be significant after 4-6 weeks. 3. Acute Right Shoulder Pain 2/2 Fall. Education provided on OTC and supportive treatment for relief. Advised to follow-up with PCP if pain continues to be significant after 4-6 weeks.  Dispo: Home. After thorough clinical  evaluation, this patient is determined to be medically stable and can be safely discharged with the previously mentioned treatment and/or outpatient follow-up/referral(s). At this time, there are no other apparent medical conditions that require further screening, evaluation or treatment.   Final diagnoses:  Fall, initial encounter  Acute bilateral low back pain without sciatica  Acute pain of right shoulder    ED Discharge Orders         Ordered    cyclobenzaprine (FLEXERIL) 5 MG tablet  2 times daily PRN     11/05/17 1757            Windy Carina, PA-C 11/06/17 1456    Long, Arlyss Repress, MD 11/08/17 [phone]

## 2017-11-05 NOTE — ED Provider Notes (Signed)
Patient placed in [facility] pathway, seen and evaluated   Chief Complaint: Fall 3 days ago   HPI:   Patient fell three days ago and has been having right arm cramping. Right shoulder blade pain, rlower back pain.  He reports that lower back and shoulder blade started hurting immediately.  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    ROS: No fevers  Physical Exam:   Gen: No distress  Neuro: Awake and Alert  Skin: Warm    Focused Exam: Gait is normal.  No oblivious abnormal motions.    Initiation of care has begun. The patient has been counseled on the process, plan, and necessity for staying for the completion/evaluation, and the remainder of the medical screening examination    Norman Clay 11/05/17 1527    Margarita Grizzle, MD 11/06/17 2107

## 2017-11-05 NOTE — ED Triage Notes (Signed)
Pt states he slipped and fell 3 days ago, c/o pain in right arm with cramping. Pt states that the pain is in right hand all the way up to right shoulder blade. Pt states that the pain started as soon as the fall happened.

## 2017-11-05 NOTE — ED Notes (Signed)
Called pt for room no answer 

## 2017-11-05 NOTE — Discharge Instructions (Addendum)
Your x-rays today were normal. Nothing is broken or dislocated. Your pain is coming from muscle strain/soreness. As we discussed, you will continue to be sore for the next 1-2 weeks.  You may use Tylenol and/or Ibuprofen (or Naproxen) for pain and inflammation. I recommend you take these 3-4 times a day for the next 2-3 days. You may also use warm or cold compresses for additional relief. I have prescribed you a muscle relaxer, Flexeril, that should help with the tightness in your low back and neck.

## 2017-11-05 NOTE — ED Notes (Signed)
Patient states he does not want labs drawn.

## 2019-02-19 IMAGING — DX DG SCAPULA*R*
2 series · 2 of 2 positions shown · non-contrast
Comparison: Portable chest dated 12/11/2014.

CLINICAL DATA: Right scapular pain following a fall 3 days ago.

EXAM:
RIGHT SCAPULA - 2+ VIEWS

[scapula ap]
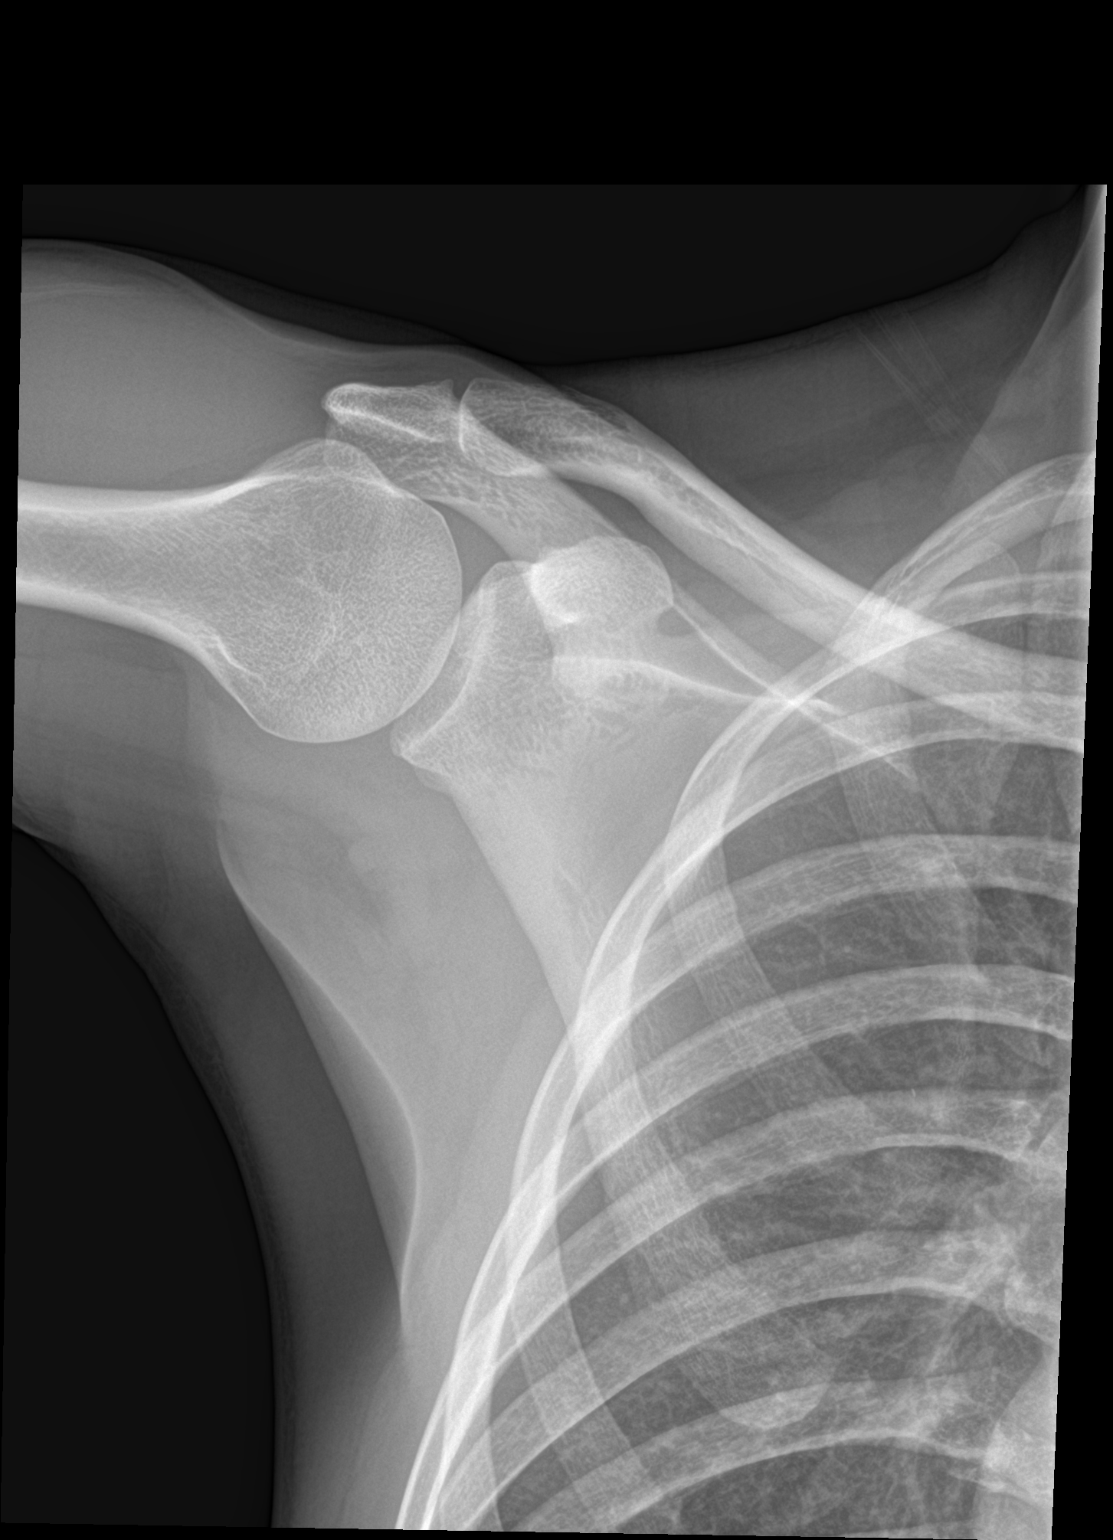

[scapula lat]
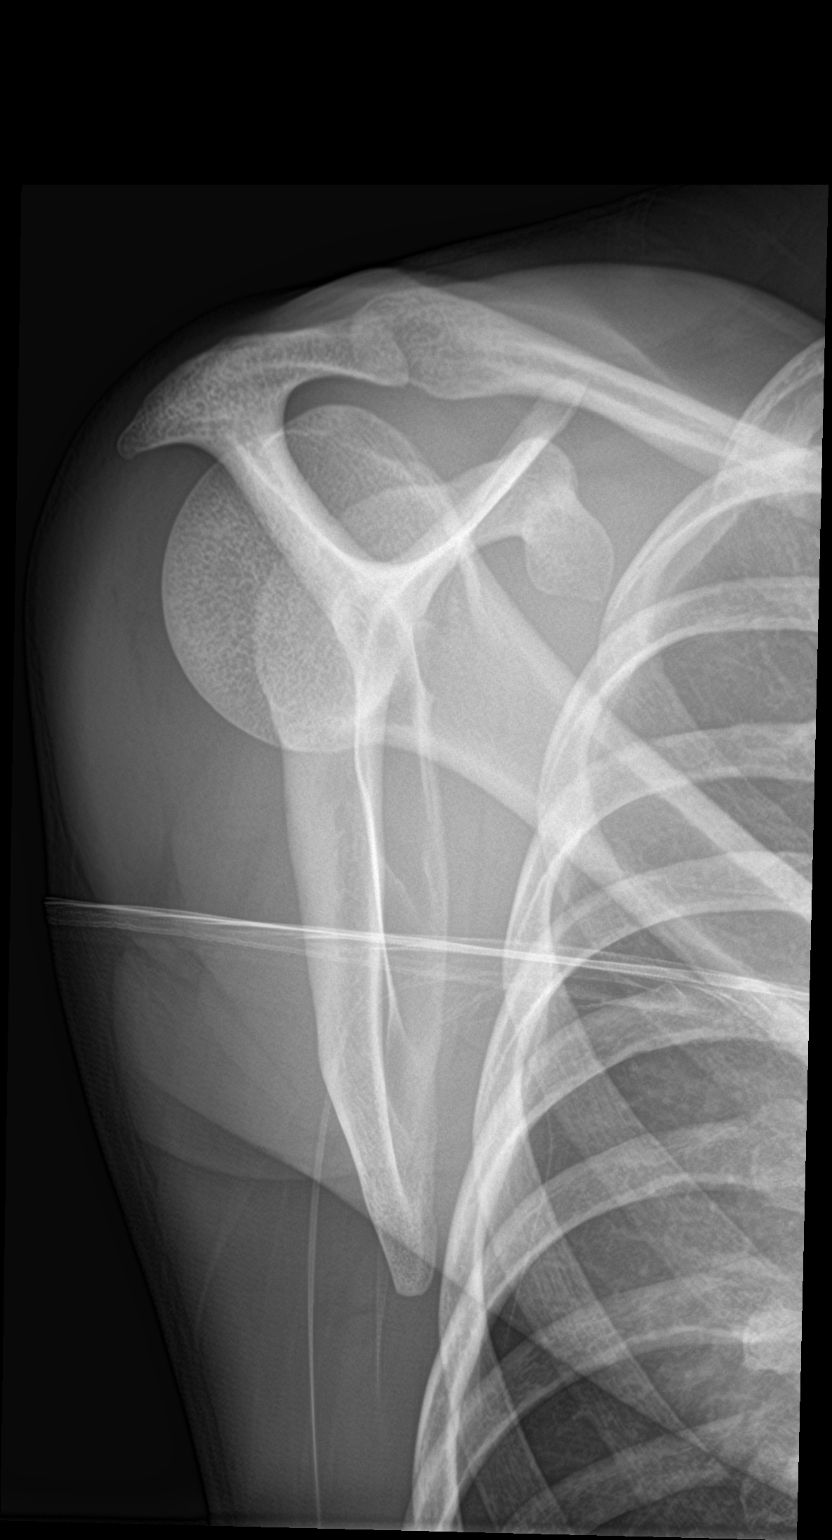

[2 of 2 positions shown; findings below may reference images not displayed]

FINDINGS: There is no evidence of fracture or other focal bone lesions. Soft
tissues are unremarkable.
IMPRESSION: Normal examination.

## 2019-02-19 IMAGING — DX DG LUMBAR SPINE COMPLETE 4+V
5 series · 5 of 5 positions shown · non-contrast
Comparison: None.

CLINICAL DATA: Low back pain following a fall 3 days ago.

EXAM:
LUMBAR SPINE - COMPLETE 4+ VIEW

[l-spine ap]
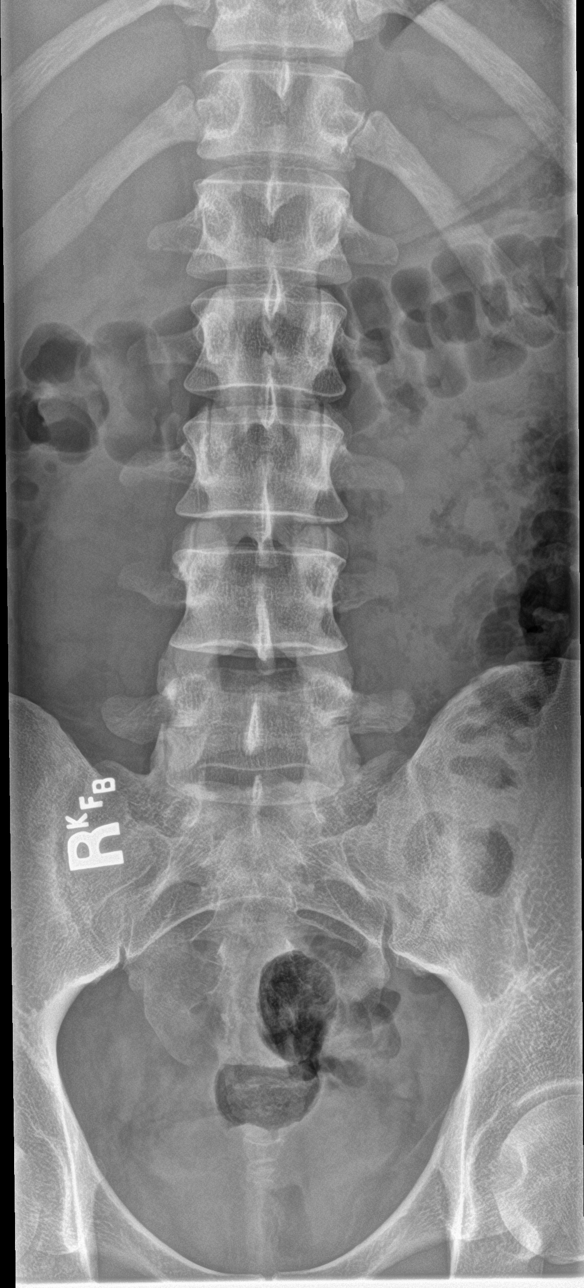

[l-spine obl (1 of 2)]
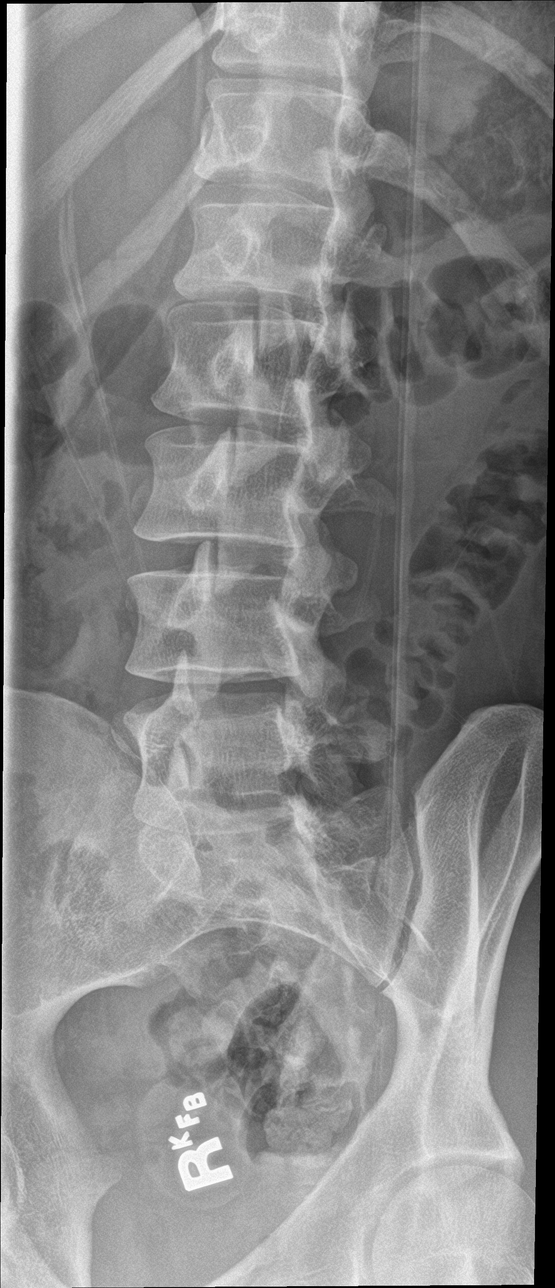

[l-spine lat]
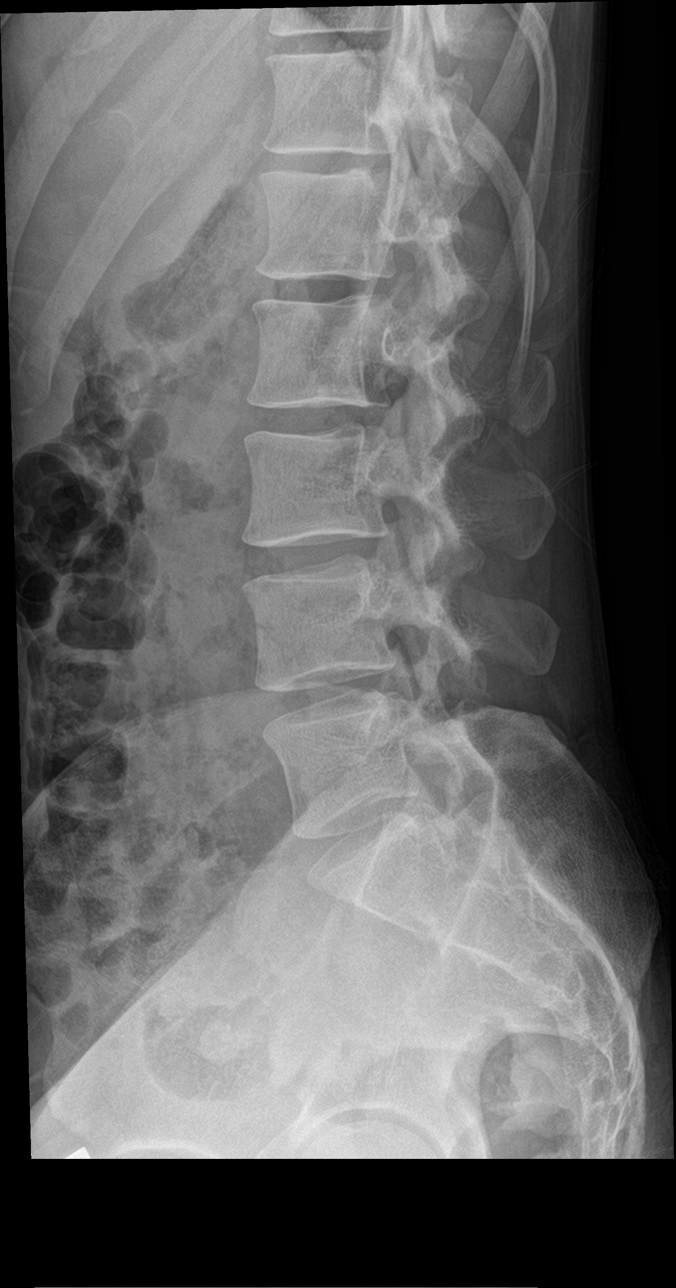

[l-spine spot]
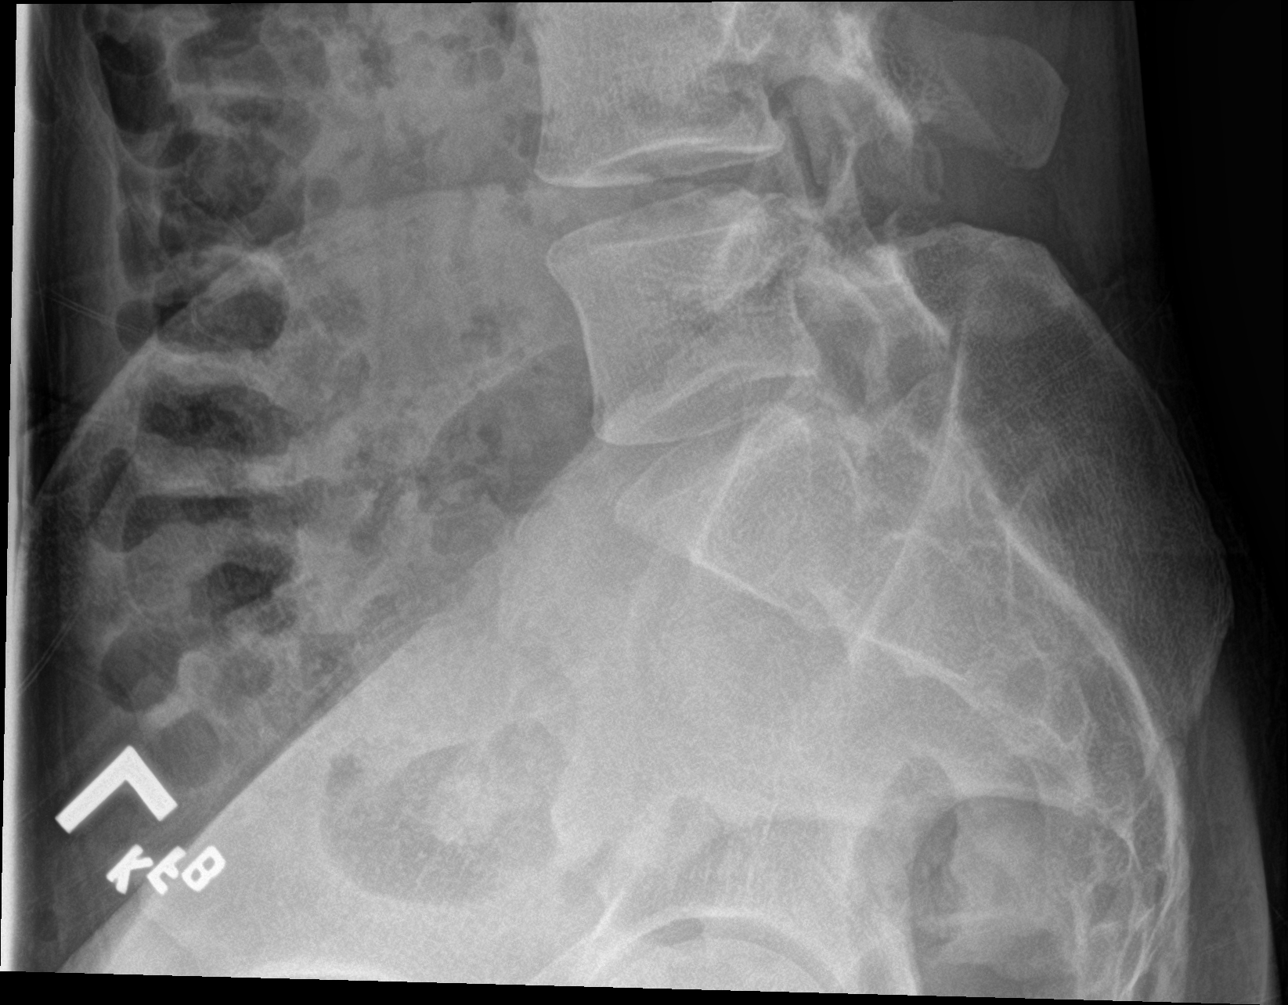

[l-spine obl (2 of 2)]
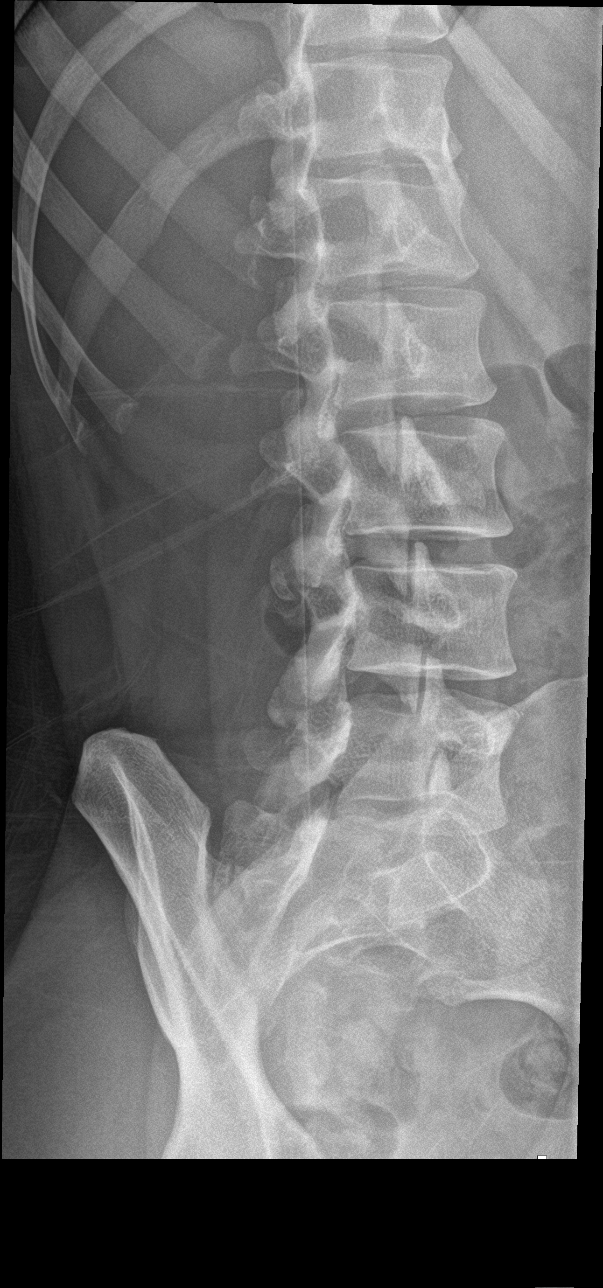

[5 of 5 positions shown; findings below may reference images not displayed]

FINDINGS: There is no evidence of lumbar spine fracture. Alignment is normal.
Intervertebral disc spaces are maintained.
IMPRESSION: Normal examination.
